# Patient Record
Sex: Male | Born: 1970
Health system: Southern US, Community
[De-identification: ages and names within clinical notes are randomized; demographics above are authoritative.]

## PROBLEM LIST (undated history)

## (undated) DIAGNOSIS — I10 Essential (primary) hypertension: Secondary | ICD-10-CM

## (undated) DIAGNOSIS — F419 Anxiety disorder, unspecified: Secondary | ICD-10-CM

## (undated) DIAGNOSIS — E785 Hyperlipidemia, unspecified: Secondary | ICD-10-CM

## (undated) DIAGNOSIS — E669 Obesity, unspecified: Secondary | ICD-10-CM

## (undated) DIAGNOSIS — R972 Elevated prostate specific antigen [PSA]: Secondary | ICD-10-CM

## (undated) DIAGNOSIS — J45909 Unspecified asthma, uncomplicated: Secondary | ICD-10-CM

## (undated) DIAGNOSIS — N401 Enlarged prostate with lower urinary tract symptoms: Secondary | ICD-10-CM

## (undated) DIAGNOSIS — C801 Malignant (primary) neoplasm, unspecified: Secondary | ICD-10-CM

## (undated) DIAGNOSIS — T7840XA Allergy, unspecified, initial encounter: Secondary | ICD-10-CM

## (undated) DIAGNOSIS — M109 Gout, unspecified: Secondary | ICD-10-CM

## (undated) DIAGNOSIS — K219 Gastro-esophageal reflux disease without esophagitis: Secondary | ICD-10-CM

## (undated) DIAGNOSIS — F32A Depression, unspecified: Secondary | ICD-10-CM

## (undated) DIAGNOSIS — R718 Other abnormality of red blood cells: Secondary | ICD-10-CM

## (undated) DIAGNOSIS — N138 Other obstructive and reflux uropathy: Secondary | ICD-10-CM

## (undated) DIAGNOSIS — R739 Hyperglycemia, unspecified: Secondary | ICD-10-CM

## (undated) HISTORY — DX: Hyperglycemia, unspecified: R73.9

## (undated) HISTORY — DX: Gastro-esophageal reflux disease without esophagitis: K21.9

## (undated) HISTORY — DX: Allergy, unspecified, initial encounter: T78.40XA

## (undated) HISTORY — DX: Obesity, unspecified: E66.9

## (undated) HISTORY — DX: Essential (primary) hypertension: I10

## (undated) HISTORY — DX: Other abnormality of red blood cells: R71.8

## (undated) HISTORY — PX: SHOULDER SURGERY: SHX246

## (undated) HISTORY — DX: Anxiety disorder, unspecified: F41.9

## (undated) HISTORY — DX: Gout, unspecified: M10.9

---

## 1998-06-01 DIAGNOSIS — R569 Unspecified convulsions: Secondary | ICD-10-CM

## 1998-06-01 HISTORY — DX: Unspecified convulsions: R56.9

## 2006-12-11 ENCOUNTER — Other Ambulatory Visit: Payer: Self-pay

## 2006-12-11 ENCOUNTER — Emergency Department: Payer: Self-pay | Admitting: Emergency Medicine

## 2009-04-07 IMAGING — US ABDOMEN ULTRASOUND
1 series · 17 of 25 positions shown · non-contrast
Comparison: none

REASON FOR EXAM: ELEVATED LFTS, EPIGASTRIC PAIN
COMMENTS:

[Series 1: abdomen ultrasound · 17 of 61 slices shown]
[im 1/61]
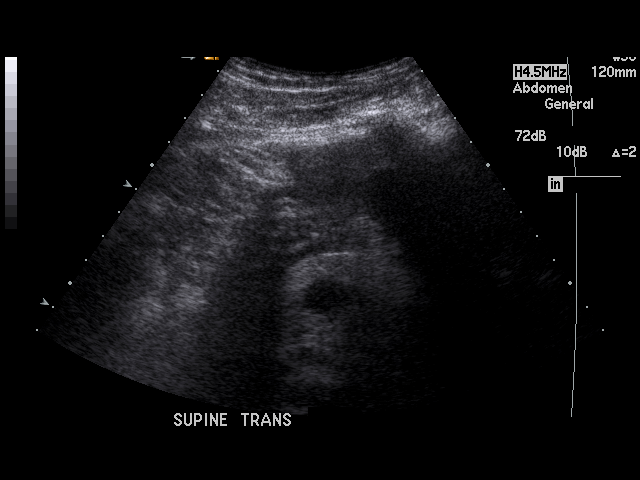
[im 6/61]
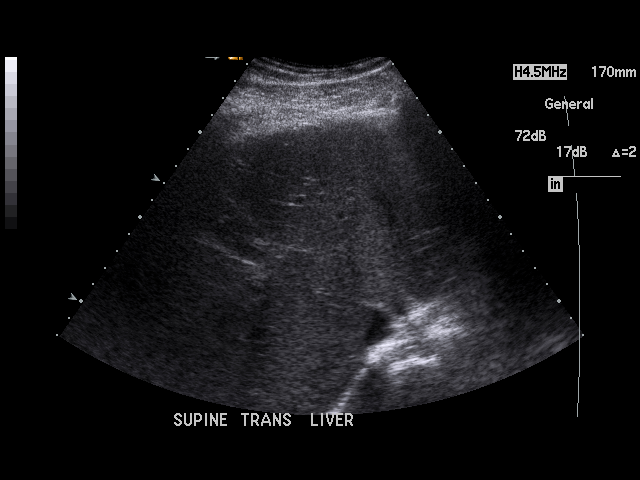
[im 8/61]
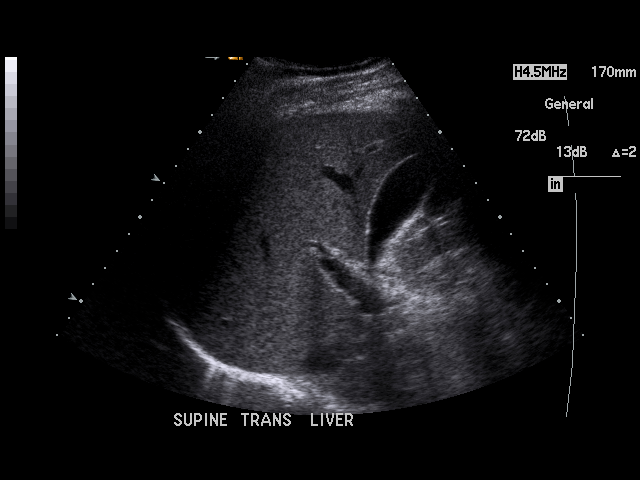
[im 13/61]
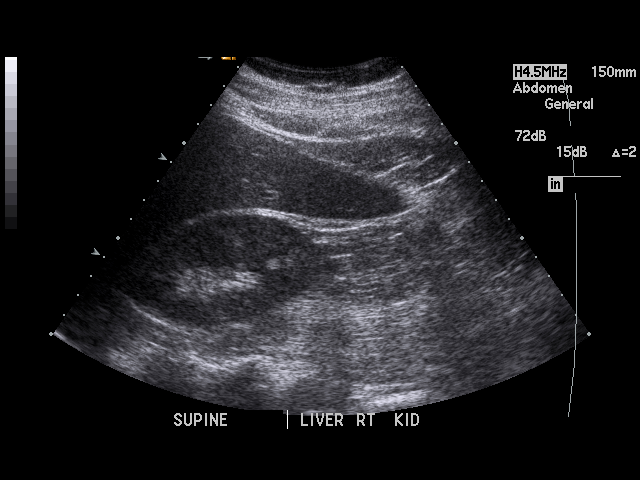
[im 16/61]
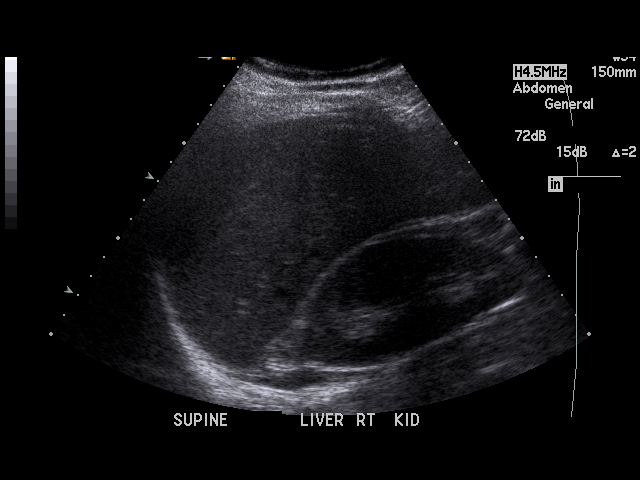
[im 21/61]
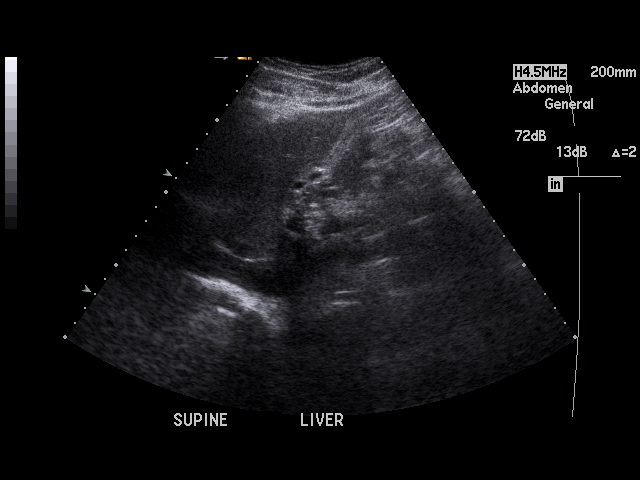
[im 23/61]
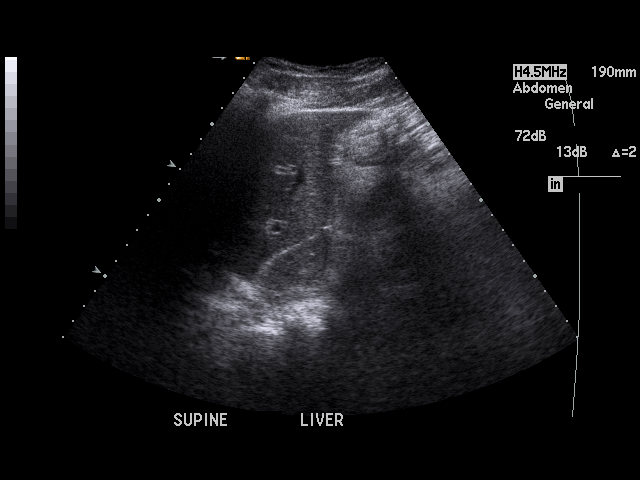
[im 28/61]
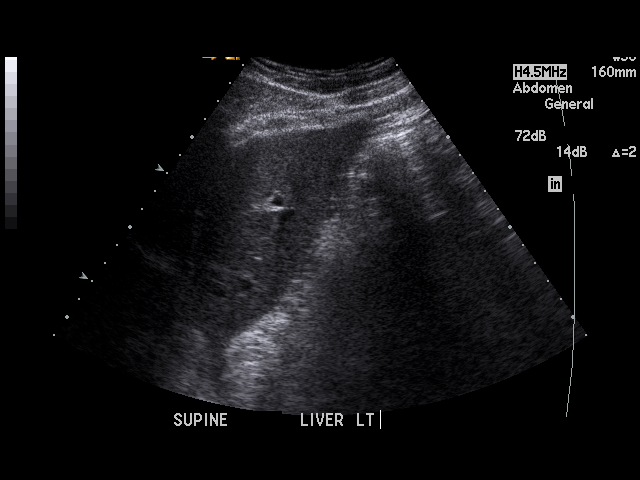
[im 31/61]
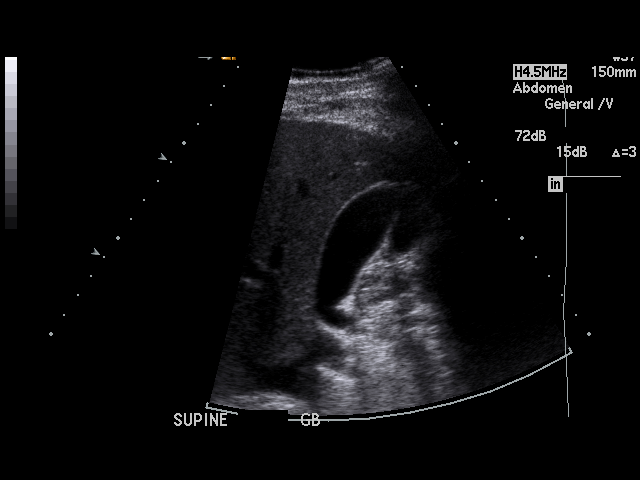
[im 33/61]
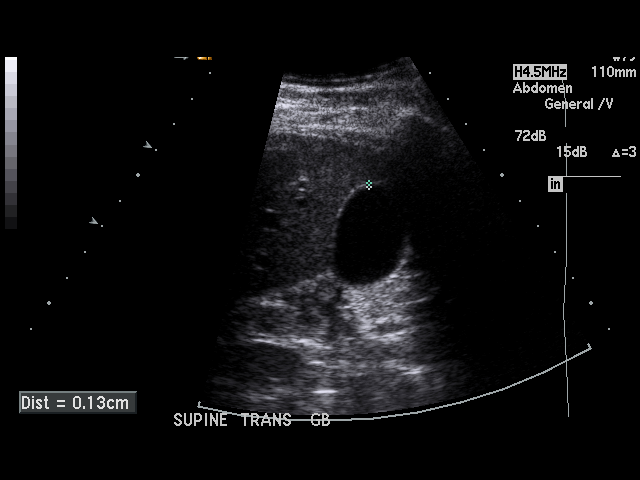
[im 38/61]
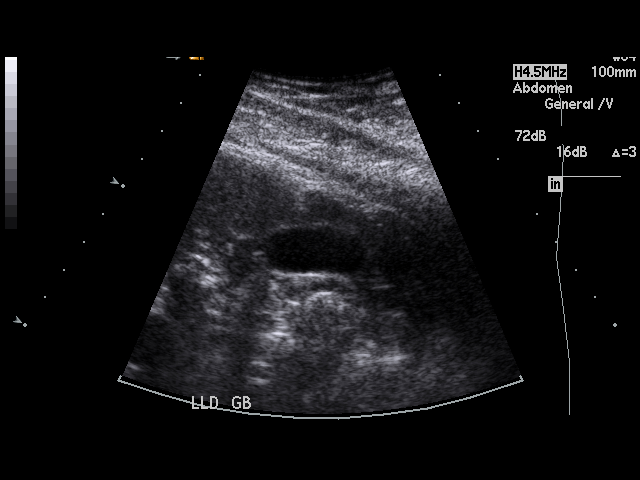
[im 41/61]
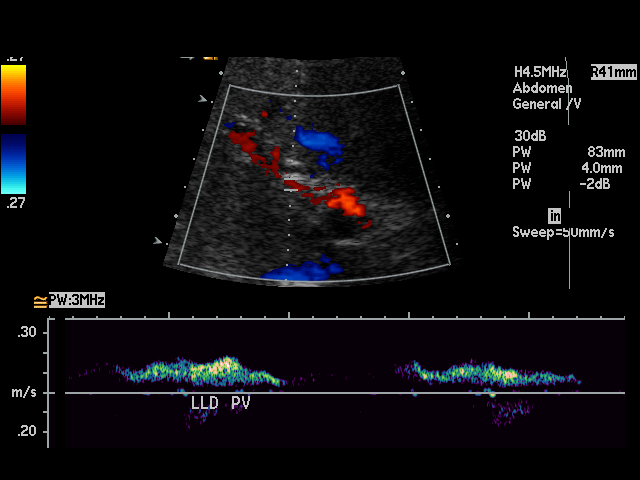
[im 46/61]
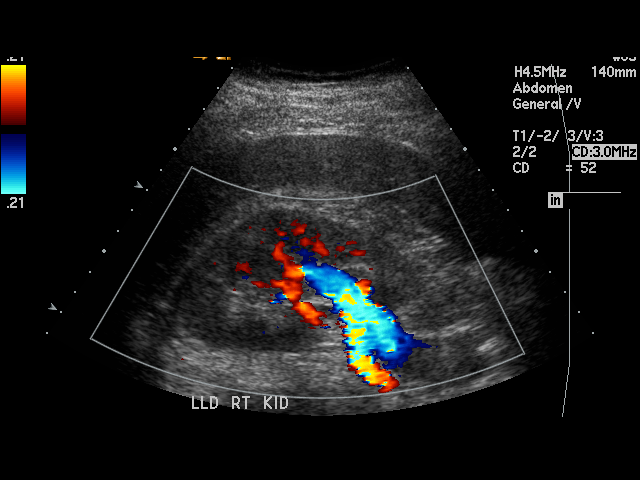
[im 48/61]
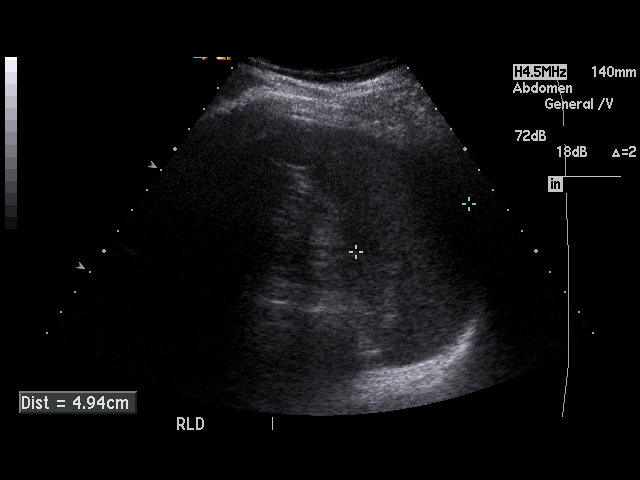
[im 53/61]
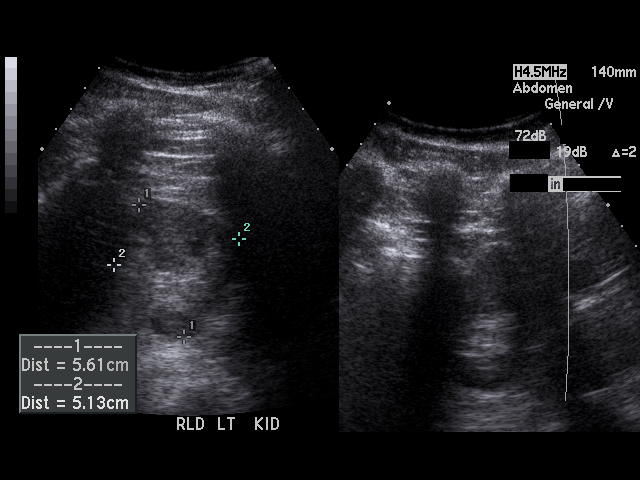
[im 56/61]
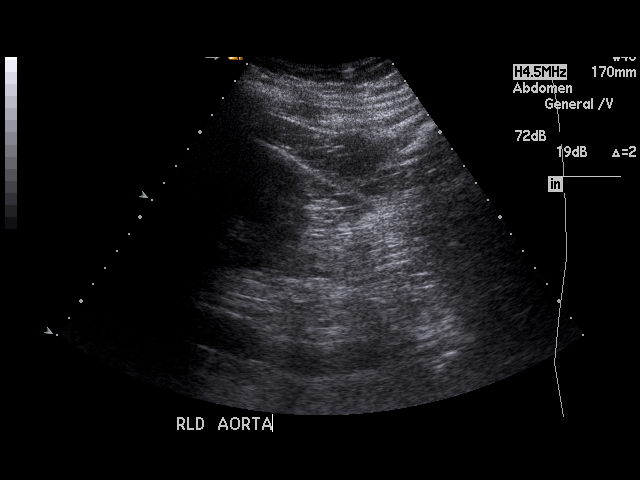
[im 61/61]
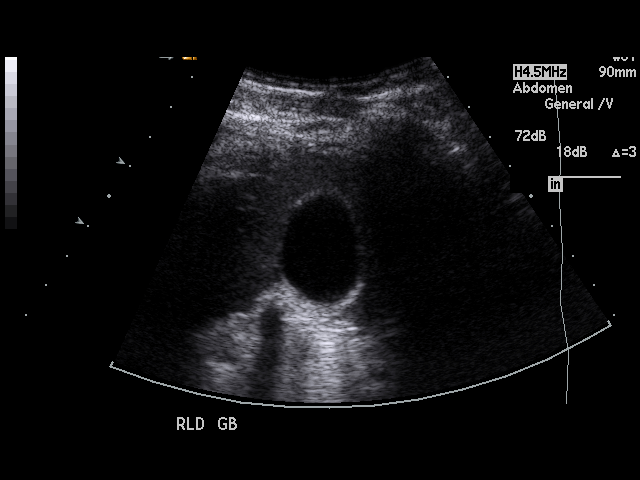

[17 of 25 positions shown; findings below may reference images not displayed]

PROCEDURE:     US  - US ABDOMEN GENERAL SURVEY  - December 12, 2006  [DATE]

RESULT:     Emergent sonographic evaluation of the abdomen is performed. The
gallbladder is normal. There is no wall thickening or stone evident. The
common bile duct diameter is normal at 3.8 mm. The pancreas is only
partially seen but shows no gross abnormality. The aorta, liver, spleen,
kidneys and portal venous flow are normal.
IMPRESSION: Normal appearing abdominal sonogram. The pancreas is only
partially seen.

## 2009-11-29 LAB — PSA: PSA: NORMAL

## 2011-11-16 LAB — LIPID PANEL
CHOLESTEROL: 150 mg/dL (ref 0–200)
HDL: 52 mg/dL (ref 35–70)
LDL CALC: 84 mg/dL
TRIGLYCERIDES: 68 mg/dL (ref 40–160)

## 2011-11-16 LAB — HEMOGLOBIN A1C: HEMOGLOBIN A1C: 5 % (ref 4.0–6.0)

## 2015-02-09 ENCOUNTER — Encounter: Payer: Self-pay | Admitting: Family Medicine

## 2015-02-09 DIAGNOSIS — I10 Essential (primary) hypertension: Secondary | ICD-10-CM | POA: Insufficient documentation

## 2015-02-09 DIAGNOSIS — F411 Generalized anxiety disorder: Secondary | ICD-10-CM | POA: Insufficient documentation

## 2015-02-09 DIAGNOSIS — R739 Hyperglycemia, unspecified: Secondary | ICD-10-CM | POA: Insufficient documentation

## 2015-02-09 DIAGNOSIS — E669 Obesity, unspecified: Secondary | ICD-10-CM | POA: Insufficient documentation

## 2015-02-09 DIAGNOSIS — J302 Other seasonal allergic rhinitis: Secondary | ICD-10-CM | POA: Insufficient documentation

## 2015-02-09 DIAGNOSIS — K219 Gastro-esophageal reflux disease without esophagitis: Secondary | ICD-10-CM | POA: Insufficient documentation

## 2015-02-11 ENCOUNTER — Ambulatory Visit: Payer: Self-pay | Admitting: Family Medicine

## 2015-10-17 ENCOUNTER — Ambulatory Visit: Payer: Self-pay | Admitting: Family Medicine

## 2015-10-23 ENCOUNTER — Telehealth: Payer: Self-pay | Admitting: Family Medicine

## 2015-10-23 DIAGNOSIS — R739 Hyperglycemia, unspecified: Secondary | ICD-10-CM

## 2015-10-23 DIAGNOSIS — I10 Essential (primary) hypertension: Secondary | ICD-10-CM

## 2015-10-23 DIAGNOSIS — Z13220 Encounter for screening for lipoid disorders: Secondary | ICD-10-CM

## 2015-10-23 DIAGNOSIS — Z114 Encounter for screening for human immunodeficiency virus [HIV]: Secondary | ICD-10-CM

## 2015-10-23 NOTE — Telephone Encounter (Signed)
Has appointment tomorrow but requesting a order for labs before his appointment

## 2015-10-24 ENCOUNTER — Encounter: Payer: Self-pay | Admitting: Family Medicine

## 2015-10-24 ENCOUNTER — Ambulatory Visit (INDEPENDENT_AMBULATORY_CARE_PROVIDER_SITE_OTHER): Payer: 59 | Admitting: Family Medicine

## 2015-10-24 VITALS — BP 138/88 | HR 87 | Temp 97.5°F | Resp 16 | Ht 69.0 in | Wt 228.9 lb

## 2015-10-24 DIAGNOSIS — F411 Generalized anxiety disorder: Secondary | ICD-10-CM | POA: Diagnosis not present

## 2015-10-24 DIAGNOSIS — R739 Hyperglycemia, unspecified: Secondary | ICD-10-CM

## 2015-10-24 DIAGNOSIS — I10 Essential (primary) hypertension: Secondary | ICD-10-CM

## 2015-10-24 MED ORDER — VALSARTAN-HYDROCHLOROTHIAZIDE 320-25 MG PO TABS: 1.0000 | ORAL_TABLET | Freq: Every day | ORAL | Status: DC

## 2015-10-24 MED ORDER — ALPRAZOLAM 0.5 MG PO TABS: 0.5000 mg | ORAL_TABLET | Freq: Two times a day (BID) | ORAL | Status: DC | PRN

## 2015-10-24 MED ORDER — CITALOPRAM HYDROBROMIDE 20 MG PO TABS: 20.0000 mg | ORAL_TABLET | Freq: Every day | ORAL | Status: DC

## 2015-10-24 NOTE — Telephone Encounter (Signed)
Please contact patient. Ready for pick up

## 2015-10-24 NOTE — Addendum Note (Signed)
Addended by: Alba CorySOWLES, Debany Vantol F on: 10/24/2015 12:33 PM   Modules accepted: Kipp BroodSmartSet

## 2015-10-24 NOTE — Progress Notes (Signed)
Name: Johnathan Eaton   MRN: [id number]    DOB: [birth year]   Date:10/24/2015       Progress Note  Subjective  Chief Complaint  Chief Complaint  Patient presents with  . Hypertension  . Medication Refill    HPI  HTN: he states he has been stretching his medication because he was about to run out of medication. His bp today is high. He denies chest pain or palpitation. He states Micardis/HCTZ is too expensive , we will try Valsartan/HCTZ  GAD: he states he is not taking medication daily. He feels nervous or on edge all the time. Worries about his parents ( both have dementia), taking alprazolam very seldom for panic attack, it lasts him 6 months.   Hyperglycemia: he denies polyphagia, polydipsia or polyuria  Patient Active Problem List   Diagnosis Date Noted  . Allergic rhinitis, seasonal 02/09/2015  . GAD (generalized anxiety disorder) 02/09/2015  . Benign hypertension 02/09/2015  . Blood glucose elevated 02/09/2015  . Gastric reflux 02/09/2015  . Obesity (BMI 30-39.9) 02/09/2015    Past Surgical History  Procedure Laterality Date  . Shoulder surgery Left     Rotator Cuff Repair    Family History  Problem Relation Age of Onset  . Hypertension Mother   . Diabetes Mother   . Hypertension Father   . Hypertension Brother     Social History   Social History  . Marital Status: Single    Spouse Name: N/A  . Number of Children: N/A  . Years of Education: N/A   Occupational History  . Not on file.   Social History Main Topics  . Smoking status: Never Smoker   . Smokeless tobacco: Never Used  . Alcohol Use: No  . Drug Use: No  . Sexual Activity:    Partners: Female   Other Topics Concern  . Not on file   Social History Narrative     Current outpatient prescriptions:  .  aspirin (ASPIRIN ADULT LOW STRENGTH) 81 MG chewable tablet, Chew by mouth., Disp: , Rfl:  .  citalopram (CELEXA) 20 MG tablet, Take 1 tablet (20 mg total) by mouth daily., Disp: 90  tablet, Rfl: 1 .  ALPRAZolam (XANAX) 0.5 MG tablet, Take 1 tablet (0.5 mg total) by mouth 2 (two) times daily as needed for anxiety. Reported on 10/24/2015, Disp: 30 tablet, Rfl: 0 .  valsartan-hydrochlorothiazide (DIOVAN-HCT) 320-25 MG tablet, Take 1 tablet by mouth daily., Disp: 90 tablet, Rfl: 1  No Known Allergies   ROS  Constitutional: Negative for fever or weight change.  Respiratory: Negative for cough and shortness of breath.   Cardiovascular: Negative for chest pain or palpitations.  Gastrointestinal: Negative for abdominal pain, no bowel changes.  Musculoskeletal: Negative for gait problem or joint swelling.  Skin: Negative for rash.  Neurological: Negative for dizziness or headache.  No other specific complaints in a complete review of systems (except as listed in HPI above).  Objective  Filed Vitals:   10/24/15 1137  BP: 138/96  Pulse: 87  Temp: 97.5 F (36.4 C)  TempSrc: Oral  Resp: 16  Height: 5\' 9"  (1.753 m)  Weight: 228 lb 14.4 oz (103.828 kg)  SpO2: 97%    Body mass index is 33.79 kg/(m^2).  Physical Exam  Constitutional: Patient appears well-developed and well-nourished.No distress.  HEENT: head atraumatic, normocephalic, pupils equal and reactive to light,  neck supple, throat within normal limits Cardiovascular: Normal rate, regular rhythm and normal heart sounds.  No murmur heard.  No BLE edema. Pulmonary/Chest: Effort normal and breath sounds normal. No respiratory distress. Abdominal: Soft.  There is no tenderness. Psychiatric: Patient has a normal mood and affect. behavior is normal. Judgment and thought content normal.  PHQ2/9: Depression screen PHQ 2/9 10/24/2015  Decreased Interest 0  Down, Depressed, Hopeless 0  PHQ - 2 Score 0     Fall Risk: Fall Risk  10/24/2015  Falls in the past year? No     Functional Status Survey: Is the patient deaf or have difficulty hearing?: No Does the patient have difficulty seeing, even when wearing  glasses/contacts?: No (patient recently got contacts but has not been able to put them in) Does the patient have difficulty concentrating, remembering, or making decisions?: No Does the patient have difficulty walking or climbing stairs?: No Does the patient have difficulty dressing or bathing?: No Does the patient have difficulty doing errands alone such as visiting a doctor's office or shopping?: No    Assessment & Plan  1. Benign hypertension  Adjust medication  - valsartan-hydrochlorothiazide (DIOVAN-HCT) 320-25 MG tablet; Take 1 tablet by mouth daily.  Dispense: 90 tablet; Refill: 1  2. Blood glucose elevated  We will check labs  3. GAD (generalized anxiety disorder)  Explained importance of compliance - citalopram (CELEXA) 20 MG tablet; Take 1 tablet (20 mg total) by mouth daily.  Dispense: 90 tablet; Refill: 1 - ALPRAZolam (XANAX) 0.5 MG tablet; Take 1 tablet (0.5 mg total) by mouth 2 (two) times daily as needed for anxiety. Reported on 10/24/2015  Dispense: 30 tablet; Refill: 0

## 2015-12-09 ENCOUNTER — Ambulatory Visit (INDEPENDENT_AMBULATORY_CARE_PROVIDER_SITE_OTHER): Payer: 59 | Admitting: Family Medicine

## 2015-12-09 ENCOUNTER — Encounter: Payer: Self-pay | Admitting: Family Medicine

## 2015-12-09 VITALS — BP 134/88 | HR 85 | Temp 98.7°F | Resp 16 | Wt 228.7 lb

## 2015-12-09 DIAGNOSIS — Z0000 Encounter for general adult medical examination without abnormal findings: Secondary | ICD-10-CM

## 2015-12-09 DIAGNOSIS — I10 Essential (primary) hypertension: Secondary | ICD-10-CM | POA: Diagnosis not present

## 2015-12-09 DIAGNOSIS — M545 Low back pain, unspecified: Secondary | ICD-10-CM

## 2015-12-09 MED ORDER — CYCLOBENZAPRINE HCL 5 MG PO TABS
5.0000 mg | ORAL_TABLET | Freq: Every day | ORAL | Status: DC
Start: 2015-12-09 — End: 2016-04-27

## 2015-12-09 NOTE — Progress Notes (Signed)
Name: Johnathan Eaton   MRN: [id number]    DOB: 1970/07/18   Date:12/09/2015       Progress Note  Subjective  Chief Complaint  Chief Complaint  Patient presents with  . Annual Exam  . Labs Only    HPI  Male Exam: he has been feeling well, not taking Celexa daily and explained it does not work that way - he has some wild dreams with the medication and offered to change it but he wants to hold off for now. He states he has normal libido, he has increase in urinary frequency for the first few hours after he takes diovan/hctz otherwise no urinary problems,. He has normal erections.   Acute back pain: he was picking a heavy item ( a mixing bowl of concrete at work ) last Wednesday and felt a pop and a pull on his back. He has been taking Advil and symptoms have improved, he states he is doing well today. No bladder or bowel continence, no radiculitis.   HTN: we switched him from Micardis/hctz to Valsartan/HCTZ - he is not checking his bp at home. He denies chest pain, palpitation or SOB  Patient Active Problem List   Diagnosis Date Noted  . Allergic rhinitis, seasonal 02/09/2015  . GAD (generalized anxiety disorder) 02/09/2015  . Benign hypertension 02/09/2015  . Blood glucose elevated 02/09/2015  . Gastric reflux 02/09/2015  . Obesity (BMI 30-39.9) 02/09/2015    Past Surgical History  Procedure Laterality Date  . Shoulder surgery Left     Rotator Cuff Repair    Family History  Problem Relation Age of Onset  . Hypertension Mother   . Diabetes Mother   . Hypertension Father   . Hypertension Brother     Social History   Social History  . Marital Status: Single    Spouse Name: N/A  . Number of Children: N/A  . Years of Education: N/A   Occupational History  . Not on file.   Social History Main Topics  . Smoking status: Never Smoker   . Smokeless tobacco: Never Used  . Alcohol Use: No  . Drug Use: No  . Sexual Activity:    Partners: Female   Other Topics  Concern  . Not on file   Social History Narrative     Current outpatient prescriptions:  .  ALPRAZolam (XANAX) 0.5 MG tablet, Take 1 tablet (0.5 mg total) by mouth 2 (two) times daily as needed for anxiety. Reported on 10/24/2015, Disp: 30 tablet, Rfl: 0 .  aspirin (ASPIRIN ADULT LOW STRENGTH) 81 MG chewable tablet, Chew by mouth., Disp: , Rfl:  .  valsartan-hydrochlorothiazide (DIOVAN-HCT) 320-25 MG tablet, Take 1 tablet by mouth daily., Disp: 90 tablet, Rfl: 1 .  citalopram (CELEXA) 20 MG tablet, Take 1 tablet (20 mg total) by mouth daily. (Patient not taking: Reported on 12/09/2015), Disp: 90 tablet, Rfl: 1 .  cyclobenzaprine (FLEXERIL) 5 MG tablet, Take 1 tablet (5 mg total) by mouth at bedtime., Disp: 30 tablet, Rfl: 0  No Known Allergies   ROS  Constitutional: Negative for fever or weight change.  Respiratory: Negative for cough and shortness of breath.   Cardiovascular: Negative for chest pain or palpitations.  Gastrointestinal: Negative for abdominal pain, no bowel changes.  Musculoskeletal: Negative for gait problem or joint swelling.  Skin: Negative for rash.  Neurological: Negative for dizziness or headache.  No other specific complaints in a complete review of systems (except as listed in HPI above).  Objective  Filed Vitals:   12/09/15 1554  BP: 134/88  Pulse: 85  Temp: 98.7 F (37.1 C)  TempSrc: Oral  Resp: 16  Weight: 228 lb 11.2 oz (103.738 kg)  SpO2: 98%    Body mass index is 33.76 kg/(m^2).  Physical Exam  Constitutional: Patient appears well-developed and well-nourished. No distress.  HENT: Head: Normocephalic and atraumatic. Ears: B TMs ok, no erythema or effusion; Nose: Nose normal. Mouth/Throat: Oropharynx is clear and moist. No oropharyngeal exudate.  Eyes: Conjunctivae and EOM are normal. Pupils are equal, round, and reactive to light. No scleral icterus.  Neck: Normal range of motion. Neck supple. No JVD present. No thyromegaly present.   Cardiovascular: Normal rate, regular rhythm and normal heart sounds.  No murmur heard. No BLE edema. Pulmonary/Chest: Effort normal and breath sounds normal. No respiratory distress. Abdominal: Soft. Bowel sounds are normal, no distension. There is no tenderness. no masses MALE GENITALIA: Normal descended testes bilaterally, no masses palpated, no hernias, no lesions, no discharge RECTAL: not done Musculoskeletal: Normal range of motion, no joint effusions. No gross deformities Neurological: he is alert and oriented to person, place, and time. No cranial nerve deficit. Coordination, balance, strength, speech and gait are normal.  Skin: Skin is warm and dry. He has a rash on his neck ( seen by Dermatologist - per patient tinea versicolor ), seems deep and irregular border, also has acanthosis nigricans, he chews on finger nails and cutictles and there is some hypopigmentation of his fingers secondary to bitting  Psychiatric: Patient has a normal mood and affect. behavior is normal. Judgment and thought content normal.  PHQ2/9: Depression screen [facility] 2/9 12/09/2015 10/24/2015  Decreased Interest 0 0  Down, Depressed, Hopeless 0 0  PHQ - 2 Score 0 0     Fall Risk: Fall Risk  12/09/2015 10/24/2015  Falls in the past year? No No       Functional Status Survey: Is the patient deaf or have difficulty hearing?: No Does the patient have difficulty seeing, even when wearing glasses/contacts?: No (patient recently got contacts but has not been able to put them in) Does the patient have difficulty concentrating, remembering, or making decisions?: No Does the patient have difficulty walking or climbing stairs?: No Does the patient have difficulty dressing or bathing?: No Does the patient have difficulty doing errands alone such as visiting a doctor's office or shopping?: No  IPSS Questionnaire (AUA-7): Over the past month.   1)  How often have you had a sensation of not emptying your bladder  completely after you finish urinating?  0 - Not at all  2)  How often have you had to urinate again less than two hours after you finished urinating? 1 - Less than 1 time in 5  3)  How often have you found you stopped and started again several times when you urinated?  0 - Not at all  4) How difficult have you found it to postpone urination?  0 - Not at all  5) How often have you had a weak urinary stream?  0 - Not at all  6) How often have you had to push or strain to begin urination?  0 - Not at all  7) How many times did you most typically get up to urinate from the time you went to bed until the time you got up in the morning?  0 - None  Total score:  0-7 mildly symptomatic   8-19 moderately symptomatic   20-35 severely  symptomatic     Assessment & Plan  1. Encounter for routine history and physical exam for male  Discussed importance of 150 minutes of physical activity weekly, eat two servings of fish weekly, eat one serving of tree nuts ( cashews, pistachios, pecans, almonds.Marland Kitchen.) every other day, eat 6 servings of fruit/vegetables daily and drink plenty of water and avoid sweet beverages.    2. Acute low back pain  He would like to take medication for prn use only, he is going to work, not affecting his ability to function, advised to continue nsaid's and take Flexeril only prn  - cyclobenzaprine (FLEXERIL) 5 MG tablet; Take 1 tablet (5 mg total) by mouth at bedtime.  Dispense: 30 tablet; Refill: 0  3. Benign hypertension  Continue medication

## 2015-12-17 ENCOUNTER — Encounter: Payer: 59 | Admitting: Family Medicine

## 2015-12-30 ENCOUNTER — Ambulatory Visit
Admission: EM | Admit: 2015-12-30 | Discharge: 2015-12-30 | Disposition: A | Discharge status: Home / Self Care | Payer: 59 | Attending: Family Medicine | Admitting: Family Medicine

## 2015-12-30 DIAGNOSIS — F419 Anxiety disorder, unspecified: Secondary | ICD-10-CM

## 2015-12-30 DIAGNOSIS — Z79899 Other long term (current) drug therapy: Secondary | ICD-10-CM | POA: Diagnosis not present

## 2015-12-30 DIAGNOSIS — Z7982 Long term (current) use of aspirin: Secondary | ICD-10-CM | POA: Insufficient documentation

## 2015-12-30 MED ORDER — ALPRAZOLAM 0.5 MG PO TABS: 0.5000 mg | ORAL_TABLET | Freq: Two times a day (BID) | ORAL | 0 refills | Status: DC | PRN

## 2015-12-30 MED ORDER — CITALOPRAM HYDROBROMIDE 20 MG PO TABS: 20.0000 mg | ORAL_TABLET | Freq: Every day | ORAL | 0 refills | Status: DC

## 2015-12-30 NOTE — ED Provider Notes (Signed)
[facility]    CSN: [id number] Arrival date & time: 12/30/15  4010  First Provider Contact:  First MD Initiated Contact with Patient 12/30/15 1939        History   Chief Complaint Chief Complaint  Patient presents with  . Anxiety    HPI Johnathan Eaton is a 45 y.o. male.   The history is provided by the patient.  Anxiety  This is a chronic problem. The problem occurs constantly. The problem has not changed since onset.Associated symptoms include chest pain. The symptoms are relieved by medications.    Past Medical History:  Diagnosis Date  . Abnormal red blood cells   . Allergy   . Anxiety   . GERD (gastroesophageal reflux disease)   . Hyperglycemia   . Hypertension   . Obesity     Patient Active Problem List   Diagnosis Date Noted  . Allergic rhinitis, seasonal 02/09/2015  . GAD (generalized anxiety disorder) 02/09/2015  . Benign hypertension 02/09/2015  . Blood glucose elevated 02/09/2015  . Gastric reflux 02/09/2015  . Obesity (BMI 30-39.9) 02/09/2015    Past Surgical History:  Procedure Laterality Date  . SHOULDER SURGERY Left    Rotator Cuff Repair       Home Medications    Prior to Admission medications   Medication Sig Start Date End Date Taking? Authorizing Provider  valsartan-hydrochlorothiazide (DIOVAN-HCT) 320-25 MG tablet Take 1 tablet by mouth daily. 10/24/15  Yes Alba Cory, MD  ALPRAZolam Prudy Feeler) 0.5 MG tablet Take 1 tablet (0.5 mg total) by mouth 2 (two) times daily as needed for anxiety. 12/30/15   Payton Mccallum, MD  aspirin (ASPIRIN ADULT LOW STRENGTH) 81 MG chewable tablet Chew by mouth. 01/20/12   Historical Provider, MD  citalopram (CELEXA) 20 MG tablet Take 1 tablet (20 mg total) by mouth daily. 12/30/15   Payton Mccallum, MD  cyclobenzaprine (FLEXERIL) 5 MG tablet Take 1 tablet (5 mg total) by mouth at bedtime. 12/09/15   Alba Cory, MD    Family History Family History  Problem Relation Age of Onset  .  Hypertension Mother   . Diabetes Mother   . Hypertension Father   . Hypertension Brother     Social History Social History  Substance Use Topics  . Smoking status: Never Smoker  . Smokeless tobacco: Never Used  . Alcohol use No     Allergies   Review of patient's allergies indicates no known allergies.   Review of Systems Review of Systems  Cardiovascular: Positive for chest pain.     Physical Exam Triage Vital Signs ED Triage Vitals  Enc Vitals Group     BP 12/30/15 1930 (!) 148/96     Pulse Rate 12/30/15 1930 78     Resp 12/30/15 1930 18     Temp 12/30/15 1930 98.5 F (36.9 C)     Temp Source 12/30/15 1930 Oral     SpO2 12/30/15 1930 100 %     Weight 12/30/15 1930 225 lb (102.1 kg)     Height 12/30/15 1930  (1.753 m)     Head Circumference --      Peak Flow --      Pain Score 12/30/15 1931 0     Pain Loc --      Pain Edu? --      Excl. in GC? --    No data found.   Updated Vital Signs BP (!) 148/96 (BP Location: Left Arm)   Pulse 78  Temp 98.5 F (36.9 C) (Oral)   Resp 18   Ht 5\' 9"  (1.753 m)   Wt 225 lb (102.1 kg)   SpO2 100%   BMI 33.23 kg/m   Visual Acuity Right Eye Distance:   Left Eye Distance:   Bilateral Distance:    Right Eye Near:   Left Eye Near:    Bilateral Near:     Physical Exam  Constitutional: He is oriented to person, place, and time. He appears well-developed and well-nourished. No distress.  HENT:  Head: Normocephalic and atraumatic.  Cardiovascular: Normal rate, regular rhythm, normal heart sounds and intact distal pulses.   No murmur heard. Pulmonary/Chest: Effort normal and breath sounds normal. No respiratory distress. He has no wheezes. He has no rales.  Abdominal: Soft. Bowel sounds are normal. He exhibits no distension and no mass. There is no tenderness. There is no rebound and no guarding.  Neurological: He is alert and oriented to person, place, and time.  Skin: No rash noted. He is not diaphoretic.    Psychiatric: His behavior is normal. Judgment and thought content normal. His affect is not blunt.  Nursing note and vitals reviewed.    UC Treatments / Results  Labs (all labs ordered are listed, but only abnormal results are displayed) Labs Reviewed - No data to display  EKG  EKG Interpretation None       Radiology No results found.  Procedures .EKG Date/Time: 12/30/2015 8:15 PM Performed by: Payton Mccallum Authorized by: Payton Mccallum   Previous ECG:    Previous ECG:  Unavailable Interpretation:    Interpretation: normal   Rate:    ECG rate assessment: normal   Rhythm:    Rhythm: sinus rhythm   Ectopy:    Ectopy: none   ST segments:    ST segments:  Normal T waves:    T waves: normal     (including critical care time)  Medications Ordered in UC Medications - No data to display   Initial Impression / Assessment and Plan / UC Course  I have reviewed the triage vital signs and the nursing notes.  Pertinent labs & imaging results that were available during my care of the patient were reviewed by me and considered in my medical decision making (see chart for details).  Clinical Course      Final Clinical Impressions(s) / UC Diagnoses   Final diagnoses:  Anxiety    New Prescriptions Discharge Medication List as of 12/30/2015  8:03 PM     Discharge Medication List as of 12/30/2015  8:03 PM     1. Labs/x-ray results and diagnosis reviewed with patient 2. rx as per orders above; reviewed possible side effects, interactions, risks and benefits  3.  Follow-up with pcp   Payton Mccallum, MD 12/30/15 2017

## 2015-12-30 NOTE — ED Triage Notes (Signed)
Patient states he feels like his anxiety has increased since Saturday, he feels like a tingling all over his body, and some lightheadedness.

## 2016-04-13 ENCOUNTER — Telehealth: Payer: Self-pay | Admitting: Family Medicine

## 2016-04-13 ENCOUNTER — Other Ambulatory Visit: Payer: Self-pay | Admitting: Family Medicine

## 2016-04-13 DIAGNOSIS — I10 Essential (primary) hypertension: Secondary | ICD-10-CM

## 2016-04-13 MED ORDER — VALSARTAN-HYDROCHLOROTHIAZIDE 320-25 MG PO TABS: 1.0000 | ORAL_TABLET | Freq: Every day | ORAL | 0 refills | Status: DC

## 2016-04-13 NOTE — Telephone Encounter (Signed)
Sent bp medication, but I will not fill Alprazolam until his follow up

## 2016-04-13 NOTE — Telephone Encounter (Signed)
Pt has upcoming appointment for 04/27/16. He is asking for a refill on alprazolam and valsartan. Please send to [facility]. He is not able to get in any sooner due to work. Please give patient a call once this has been taken care of.

## 2016-04-14 NOTE — Telephone Encounter (Signed)
Pt verbally informed. °

## 2016-04-14 NOTE — Telephone Encounter (Signed)
Please inform patient. Thanks!

## 2016-04-27 ENCOUNTER — Ambulatory Visit (INDEPENDENT_AMBULATORY_CARE_PROVIDER_SITE_OTHER): Payer: 59 | Admitting: Family Medicine

## 2016-04-27 ENCOUNTER — Ambulatory Visit: Payer: 59 | Admitting: Family Medicine

## 2016-04-27 ENCOUNTER — Encounter: Payer: Self-pay | Admitting: Family Medicine

## 2016-04-27 VITALS — BP 126/86 | HR 78 | Temp 98.2°F | Resp 16 | Ht 69.0 in | Wt 226.6 lb

## 2016-04-27 DIAGNOSIS — B360 Pityriasis versicolor: Secondary | ICD-10-CM

## 2016-04-27 DIAGNOSIS — I10 Essential (primary) hypertension: Secondary | ICD-10-CM

## 2016-04-27 DIAGNOSIS — E669 Obesity, unspecified: Secondary | ICD-10-CM | POA: Diagnosis not present

## 2016-04-27 DIAGNOSIS — Z23 Encounter for immunization: Secondary | ICD-10-CM

## 2016-04-27 DIAGNOSIS — F411 Generalized anxiety disorder: Secondary | ICD-10-CM

## 2016-04-27 DIAGNOSIS — R739 Hyperglycemia, unspecified: Secondary | ICD-10-CM

## 2016-04-27 MED ORDER — VALSARTAN-HYDROCHLOROTHIAZIDE 320-25 MG PO TABS: 1.0000 | ORAL_TABLET | Freq: Every day | ORAL | 0 refills | Status: DC

## 2016-04-27 MED ORDER — SELENIUM SULFIDE 2.25 % EX SHAM: 2.5000 mL | MEDICATED_SHAMPOO | CUTANEOUS | 2 refills | Status: DC

## 2016-04-27 MED ORDER — CITALOPRAM HYDROBROMIDE 20 MG PO TABS: 20.0000 mg | ORAL_TABLET | Freq: Every day | ORAL | 0 refills | Status: DC

## 2016-04-27 MED ORDER — ALPRAZOLAM 0.5 MG PO TABS: 0.5000 mg | ORAL_TABLET | Freq: Two times a day (BID) | ORAL | 0 refills | Status: DC | PRN

## 2016-04-27 NOTE — Progress Notes (Signed)
Name: Johnathan Eaton   MRN: [id number]    DOB: [birth year]   Date:04/27/2016       Progress Note  Subjective  Chief Complaint  Chief Complaint  Patient presents with  . Hypertension    pt 6 month follow up pt stated that there are no changes  . Obesity  . Flu Vaccine    pt declined    HPI  HTN: he is taking  Valsartan/HCTZ - he is not checking his bp at home. He denies chest pain, palpitation or SOB. No side effects of medication . Explained importance of having labs done  GAD: he states he is not taking medication daily. He feels nervous or on edge all the time. Worries about his parents ( both have dementia), taking alprazolam very seldom for panic attack, it lasts him 6 months, he takes Citalopram for a period of time when very stressed out, but refuses to take it long term, explained it works best when he takes it daily.   Obesity: his weight is stable, he states he had lost weight, but gained it back during holidays. Discussed life style changes  Hyperglycemia: he denies polyphagia, polydipsia or polyuria.     Patient Active Problem List   Diagnosis Date Noted  . Allergic rhinitis, seasonal 02/09/2015  . GAD (generalized anxiety disorder) 02/09/2015  . Benign hypertension 02/09/2015  . Blood glucose elevated 02/09/2015  . Gastric reflux 02/09/2015  . Obesity (BMI 30-39.9) 02/09/2015    Past Surgical History:  Procedure Laterality Date  . SHOULDER SURGERY Left    Rotator Cuff Repair    Family History  Problem Relation Age of Onset  . Hypertension Mother   . Diabetes Mother   . Hypertension Father   . Hypertension Brother     Social History   Social History  . Marital status: Single    Spouse name: N/A  . Number of children: N/A  . Years of education: N/A   Occupational History  . Not on file.   Social History Main Topics  . Smoking status: Never Smoker  . Smokeless tobacco: Never Used  . Alcohol use No  . Drug use: No  . Sexual activity: Yes     Partners: Female   Other Topics Concern  . Not on file   Social History Narrative  . No narrative on file     Current Outpatient Prescriptions:  .  ALPRAZolam (XANAX) 0.5 MG tablet, Take 1 tablet (0.5 mg total) by mouth 2 (two) times daily as needed for anxiety., Disp: 30 tablet, Rfl: 0 .  aspirin (ASPIRIN ADULT LOW STRENGTH) 81 MG chewable tablet, Chew by mouth., Disp: , Rfl:  .  citalopram (CELEXA) 20 MG tablet, Take 1 tablet (20 mg total) by mouth daily., Disp: 90 tablet, Rfl: 0 .  valsartan-hydrochlorothiazide (DIOVAN-HCT) 320-25 MG tablet, Take 1 tablet by mouth daily., Disp: 90 tablet, Rfl: 0 .  Selenium Sulfide 2.25 % SHAM, Apply 2.5 mLs topically 3 (three) times a week., Disp: 180 mL, Rfl: 2  No Known Allergies   ROS  Constitutional: Negative for fever or weight change.  Respiratory: Negative for cough and shortness of breath.   Cardiovascular: Negative for chest pain or palpitations.  Gastrointestinal: Negative for abdominal pain, no bowel changes.  Musculoskeletal: Negative for gait problem or joint swelling.  Skin: Negative for rash.  Neurological: Negative for dizziness or headache.  No other specific complaints in a complete review of systems (except as listed in HPI above).  Objective  Vitals:   04/27/16 1516  BP: 126/86  Pulse: 78  Resp: 16  Temp: 98.2 F (36.8 C)  SpO2: 98%  Weight: 226 lb 9 oz (102.8 kg)  Height: 5\' 9"  (1.753 m)    Body mass index is 33.46 kg/m.  Physical Exam  Constitutional: Patient appears well-developed and well-nourished. Obese  No distress.  HEENT: head atraumatic, normocephalic, pupils equal and reactive to light, neck supple, throat within normal limits Cardiovascular: Normal rate, regular rhythm and normal heart sounds.  No murmur heard. No BLE edema. Pulmonary/Chest: Effort normal and breath sounds normal. No respiratory distress. Abdominal: Soft.  There is no tenderness. Psychiatric: Patient has a normal mood  and affect. behavior is normal. Judgment and thought content normal. Skin: acanthosis nigricans. He also has tinea versicolor on neck  PHQ2/9: Depression screen [facility] 2/9 12/09/2015 10/24/2015  Decreased Interest 0 0  Down, Depressed, Hopeless 0 0  PHQ - 2 Score 0 0    Fall Risk: Fall Risk  12/09/2015 10/24/2015  Falls in the past year? No No     Assessment & Plan  1. Benign hypertension  Well controlled, continue medication  - valsartan-hydrochlorothiazide (DIOVAN-HCT) 320-25 MG tablet; Take 1 tablet by mouth daily.  Dispense: 90 tablet; Refill: 0  2. Blood glucose elevated  Needs to have labs done  3. GAD (generalized anxiety disorder)  - ALPRAZolam (XANAX) 0.5 MG tablet; Take 1 tablet (0.5 mg total) by mouth 2 (two) times daily as needed for anxiety.  Dispense: 30 tablet; Refill: 0 - citalopram (CELEXA) 20 MG tablet; Take 1 tablet (20 mg total) by mouth daily.  Dispense: 90 tablet; Refill: 0   4. Obesity (BMI 30-39.9)  Discussed with the patient the risk posed by an increased BMI. Discussed importance of portion control, calorie counting and at least 150 minutes of physical activity weekly. Avoid sweet beverages and drink more water. Eat at least 6 servings of fruit and vegetables daily   5. Needs flu shot  Refused   6. Tinea versicolor  - Selenium Sulfide 2.25 % SHAM; Apply 2.5 mLs topically 3 (three) times a week.  Dispense: 180 mL; Refill: 2

## 2016-04-29 ENCOUNTER — Telehealth: Payer: Self-pay

## 2016-04-29 NOTE — Telephone Encounter (Signed)
I am sorry, controlled medication, cannot give another prescription

## 2016-04-29 NOTE — Telephone Encounter (Signed)
Patient lose his Alprazolam prescription and is asking for a refill. Patient states he never filled the one before and went taking that one to the pharmacy they told him it had expired. Told the patient to look all over since he just got the prescription. He stated he has looked and was going to take the prescription in to be filled and could not find it. Please advise.

## 2016-04-30 NOTE — Telephone Encounter (Signed)
Patient informed. 

## 2016-05-04 ENCOUNTER — Encounter: Payer: Self-pay | Admitting: Family Medicine

## 2016-05-04 ENCOUNTER — Ambulatory Visit (INDEPENDENT_AMBULATORY_CARE_PROVIDER_SITE_OTHER): Payer: 59 | Admitting: Family Medicine

## 2016-05-04 VITALS — BP 138/82 | HR 122 | Temp 102.4°F | Resp 16 | Ht 69.0 in | Wt 225.6 lb

## 2016-05-04 DIAGNOSIS — R05 Cough: Secondary | ICD-10-CM | POA: Diagnosis not present

## 2016-05-04 DIAGNOSIS — R6889 Other general symptoms and signs: Secondary | ICD-10-CM

## 2016-05-04 DIAGNOSIS — R059 Cough, unspecified: Secondary | ICD-10-CM

## 2016-05-04 DIAGNOSIS — R509 Fever, unspecified: Secondary | ICD-10-CM | POA: Diagnosis not present

## 2016-05-04 DIAGNOSIS — J029 Acute pharyngitis, unspecified: Secondary | ICD-10-CM

## 2016-05-04 LAB — POCT INFLUENZA A/B: Influenza A, POC: NEGATIVE

## 2016-05-04 LAB — POCT RAPID STREP A (OFFICE): Rapid Strep A Screen: NEGATIVE

## 2016-05-04 MED ORDER — OSELTAMIVIR PHOSPHATE 75 MG PO CAPS: 75.0000 mg | ORAL_CAPSULE | Freq: Two times a day (BID) | ORAL | 0 refills | Status: DC

## 2016-05-04 NOTE — Progress Notes (Signed)
Name: Johnathan RacerDarrell K Eaton   MRN: [id number]    DOB: 1970/09/05   Date:05/04/2016       Progress Note  Subjective  Chief Complaint  Chief Complaint  Patient presents with  . Sore Throat    pt stated that symptoms started yesterday  . Headache  . Cough  . Chills  . Fever    HPI  Flu like symptoms: he was in his garage with the garage door closed and sprayed WD 40 and immediately developed sore throat and headache. He also has a dry cough, no rhinorrhea or nasal congestion. He has noticed some chills this morning. He has normal appetite, denies body aches. He has been taking generic loratadine but did not take any nsaid's or tylenol. He denies SOB or wheezing   Patient Active Problem List   Diagnosis Date Noted  . Allergic rhinitis, seasonal 02/09/2015  . GAD (generalized anxiety disorder) 02/09/2015  . Benign hypertension 02/09/2015  . Blood glucose elevated 02/09/2015  . Gastric reflux 02/09/2015  . Obesity (BMI 30-39.9) 02/09/2015    Past Surgical History:  Procedure Laterality Date  . SHOULDER SURGERY Left    Rotator Cuff Repair    Family History  Problem Relation Age of Onset  . Hypertension Mother   . Diabetes Mother   . Hypertension Father   . Hypertension Brother     Social History   Social History  . Marital status: Single    Spouse name: N/A  . Number of children: N/A  . Years of education: N/A   Occupational History  . Not on file.   Social History Main Topics  . Smoking status: Never Smoker  . Smokeless tobacco: Never Used  . Alcohol use No  . Drug use: No  . Sexual activity: Yes    Partners: Female   Other Topics Concern  . Not on file   Social History Narrative  . No narrative on file     Current Outpatient Prescriptions:  .  ALPRAZolam (XANAX) 0.5 MG tablet, Take 1 tablet (0.5 mg total) by mouth 2 (two) times daily as needed for anxiety., Disp: 30 tablet, Rfl: 0 .  aspirin (ASPIRIN ADULT LOW STRENGTH) 81 MG chewable tablet, Chew by  mouth., Disp: , Rfl:  .  citalopram (CELEXA) 20 MG tablet, Take 1 tablet (20 mg total) by mouth daily., Disp: 90 tablet, Rfl: 0 .  oseltamivir (TAMIFLU) 75 MG capsule, Take 1 capsule (75 mg total) by mouth 2 (two) times daily., Disp: 10 capsule, Rfl: 0 .  Selenium Sulfide 2.25 % SHAM, Apply 2.5 mLs topically 3 (three) times a week., Disp: 180 mL, Rfl: 2 .  valsartan-hydrochlorothiazide (DIOVAN-HCT) 320-25 MG tablet, Take 1 tablet by mouth daily., Disp: 90 tablet, Rfl: 0  No Known Allergies   ROS  Ten systems reviewed and is negative except as mentioned in HPI   Objective  Vitals:   05/04/16 1053  BP: 138/82  Pulse: (!) 122  Resp: 16  Temp: (!) 102.4 F (39.1 C)  TempSrc: Oral  SpO2: 95%  Weight: 225 lb 9 oz (102.3 kg)  Height: 5\' 9"  (1.753 m)    Body mass index is 33.31 kg/m.  Physical Exam  Constitutional: Patient appears well-developed and well-nourished. Obese  No distress.  HEENT: head atraumatic, normocephalic, pupils equal and reactive to light, ears normal TM bilaterally, neck supple, throat within normal limits Cardiovascular: Normal rate, regular rhythm and normal heart sounds.  No murmur heard. No BLE edema. Pulmonary/Chest: Effort normal and  breath sounds normal. No respiratory distress. Abdominal: Soft.  There is no tenderness. Psychiatric: Patient has a normal mood and affect. behavior is normal. Judgment and thought content normal.  Recent Results (from the past 2160 hour(s))  POCT rapid strep A     Status: None   Collection Time: 05/04/16 11:36 AM  Result Value Ref Range   Rapid Strep A Screen Negative Negative  POCT Influenza A/B     Status: None   Collection Time: 05/04/16 11:36 AM  Result Value Ref Range   Influenza A, POC Negative Negative   Influenza B, POC  Negative     PHQ2/9: [facility] 12/09/2015 10/24/2015  Decreased Interest 0 0  Down, Depressed, Hopeless 0 0  PHQ - 2 Score 0 0     Fall Risk: Fall Risk  12/09/2015  10/24/2015  Falls in the past year? No No     Assessment & Plan  1. Sore throat  - POCT rapid strep A - oseltamivir (TAMIFLU) 75 MG capsule; Take 1 capsule (75 mg total) by mouth 2 (two) times daily.  Dispense: 10 capsule; Refill: 0  2. Cough  - POCT rapid strep A - POCT Influenza A/B - oseltamivir (TAMIFLU) 75 MG capsule; Take 1 capsule (75 mg total) by mouth 2 (two) times daily.  Dispense: 10 capsule; Refill: 0  3. Fever chills   POCT Influenza A/B - oseltamivir (TAMIFLU) 75 MG capsule; Take 1 capsule (75 mg total) by mouth 2 (two) times daily.  Dispense: 10 capsule; Refill: 0   4. Flu-like symptoms  Daughter missed school today because she has diarrhea, symptoms are flu like, not sure if secondary to inhalation, but normal auscultation during exam, we will try Tamiflu and if no improvement call back for CXR to make sure not secondary to inhalation injury  - oseltamivir (TAMIFLU) 75 MG capsule; Take 1 capsule (75 mg total) by mouth 2 (two) times daily.  Dispense: 10 capsule; Refill: 0

## 2016-09-03 ENCOUNTER — Other Ambulatory Visit: Payer: Self-pay

## 2016-09-03 DIAGNOSIS — I10 Essential (primary) hypertension: Secondary | ICD-10-CM

## 2016-09-03 MED ORDER — VALSARTAN-HYDROCHLOROTHIAZIDE 320-25 MG PO TABS: 1.0000 | ORAL_TABLET | Freq: Every day | ORAL | 0 refills | Status: DC

## 2016-09-03 NOTE — Telephone Encounter (Signed)
Patient has an upcoming appointment 10/19/16 with Dr. Carlynn Purl

## 2016-10-19 ENCOUNTER — Encounter: Payer: Self-pay | Admitting: Family Medicine

## 2016-10-19 ENCOUNTER — Ambulatory Visit (INDEPENDENT_AMBULATORY_CARE_PROVIDER_SITE_OTHER): Payer: Managed Care, Other (non HMO) | Admitting: Family Medicine

## 2016-10-19 VITALS — BP 136/92 | HR 101 | Temp 98.8°F | Resp 16 | Ht 69.0 in | Wt 229.0 lb

## 2016-10-19 DIAGNOSIS — Z23 Encounter for immunization: Secondary | ICD-10-CM | POA: Diagnosis not present

## 2016-10-19 DIAGNOSIS — E669 Obesity, unspecified: Secondary | ICD-10-CM

## 2016-10-19 DIAGNOSIS — I10 Essential (primary) hypertension: Secondary | ICD-10-CM | POA: Diagnosis not present

## 2016-10-19 DIAGNOSIS — Z13220 Encounter for screening for lipoid disorders: Secondary | ICD-10-CM | POA: Diagnosis not present

## 2016-10-19 DIAGNOSIS — R739 Hyperglycemia, unspecified: Secondary | ICD-10-CM | POA: Diagnosis not present

## 2016-10-19 DIAGNOSIS — F411 Generalized anxiety disorder: Secondary | ICD-10-CM | POA: Diagnosis not present

## 2016-10-19 MED ORDER — ATENOLOL 25 MG PO TABS: 25.0000 mg | ORAL_TABLET | Freq: Every day | ORAL | 1 refills | Status: DC

## 2016-10-19 MED ORDER — CITALOPRAM HYDROBROMIDE 20 MG PO TABS: 20.0000 mg | ORAL_TABLET | Freq: Every day | ORAL | 1 refills | Status: DC

## 2016-10-19 MED ORDER — VALSARTAN-HYDROCHLOROTHIAZIDE 320-25 MG PO TABS: 1.0000 | ORAL_TABLET | Freq: Every day | ORAL | 1 refills | Status: DC

## 2016-10-19 MED ORDER — ALPRAZOLAM 0.5 MG PO TABS: 0.5000 mg | ORAL_TABLET | Freq: Two times a day (BID) | ORAL | 0 refills | Status: DC | PRN

## 2016-10-19 NOTE — Progress Notes (Signed)
Name: Johnathan Eaton   MRN: [id number]    DOB: 12/14/1970   Date:10/19/2016       Progress Note  Subjective  Chief Complaint  Chief Complaint  Patient presents with  . Medication Refill    6 month F/U  . Hypertension    Denies any symptoms  . Anxiety    Stressed taking care of parents  . Obesity    HPI   HTN: he is taking  Valsartan/HCTZ, medication is expensive and he will ask pharmacist if there is a cheaper type. He denies chest pain, palpitation ( except when he is anxious ) or SOB. No side effects of medication . Explained importance of having labs done, he will return fasting.   GAD: he states he has resumed Celexa daily ( 3 weeks ago) because he has been more worried about his parents lately, they both dementia and he gets very anxious worrying about them. He got his parents are in different nursing homes.  He feels nervous or on edge all the time.taking alprazolam very seldom for panic attack, it lasts him 6 months.   Obesity: his weight is stable, explained the importance of life style modification, stop drinking sweet beverages.    Hyperglycemia: he denies polyphagia, polydipsia or polyuria, we will check labs.   Patient Active Problem List   Diagnosis Date Noted  . Allergic rhinitis, seasonal 02/09/2015  . GAD (generalized anxiety disorder) 02/09/2015  . Benign hypertension 02/09/2015  . Blood glucose elevated 02/09/2015  . Gastric reflux 02/09/2015  . Obesity (BMI 30-39.9) 02/09/2015    Past Surgical History:  Procedure Laterality Date  . SHOULDER SURGERY Left    Rotator Cuff Repair    Family History  Problem Relation Age of Onset  . Hypertension Mother   . Diabetes Mother   . Hypertension Father   . Hypertension Brother     Social History   Social History  . Marital status: Single    Spouse name: N/A  . Number of children: N/A  . Years of education: N/A   Occupational History  . Not on file.   Social History Main Topics  . Smoking  status: Never Smoker  . Smokeless tobacco: Never Used  . Alcohol use No  . Drug use: No  . Sexual activity: Yes    Partners: Female   Other Topics Concern  . Not on file   Social History Narrative  . No narrative on file     Current Outpatient Prescriptions:  .  ALPRAZolam (XANAX) 0.5 MG tablet, Take 1 tablet (0.5 mg total) by mouth 2 (two) times daily as needed for anxiety., Disp: 30 tablet, Rfl: 0 .  aspirin (ASPIRIN ADULT LOW STRENGTH) 81 MG chewable tablet, Chew by mouth., Disp: , Rfl:  .  citalopram (CELEXA) 20 MG tablet, Take 1 tablet (20 mg total) by mouth daily., Disp: 90 tablet, Rfl: 1 .  Selenium Sulfide 2.25 % SHAM, Apply 2.5 mLs topically 3 (three) times a week., Disp: 180 mL, Rfl: 2 .  valsartan-hydrochlorothiazide (DIOVAN-HCT) 320-25 MG tablet, Take 1 tablet by mouth daily., Disp: 90 tablet, Rfl: 1 .  atenolol (TENORMIN) 25 MG tablet, Take 1 tablet (25 mg total) by mouth daily. In the pm's prn anxiety and bp, Disp: 90 tablet, Rfl: 1  No Known Allergies   ROS  Constitutional: Negative for fever or weight change.  Respiratory: Negative for cough and shortness of breath.   Cardiovascular: Negative for chest pain or palpitations.  Gastrointestinal: Negative for  abdominal pain, no bowel changes.  Musculoskeletal: Negative for gait problem or joint swelling.  Skin: Negative for rash.  Neurological: Negative for dizziness or headache.  No other specific complaints in a complete review of systems (except as listed in HPI above).  Objective  Vitals:   10/19/16 1558  BP: (!) 136/92  Pulse: (!) 101  Resp: 16  Temp: 98.8 F (37.1 C)  TempSrc: Oral  SpO2: 98%  Weight: 229 lb (103.9 kg)  Height: 5\' 9"  (1.753 m)    Body mass index is 33.82 kg/m.  Physical Exam  Constitutional: Patient appears well-developed and well-nourished. Obese No distress.  HEENT: head atraumatic, normocephalic, pupils equal and reactive to light, neck supple, throat within normal  limits Cardiovascular: Normal rate, regular rhythm and normal heart sounds.  No murmur heard. No BLE edema. Pulmonary/Chest: Effort normal and breath sounds normal. No respiratory distress. Abdominal: Soft.  There is no tenderness. Psychiatric: Patient has a normal mood and affect. behavior is normal. Judgment and thought content normal.  PHQ2/9: Depression screen [facility] 2/9 10/19/2016 12/09/2015 10/24/2015  Decreased Interest 0 0 0  Down, Depressed, Hopeless 0 0 0  PHQ - 2 Score 0 0 0     Fall Risk: Fall Risk  10/19/2016 12/09/2015 10/24/2015  Falls in the past year? No No No     Functional Status Survey: Is the patient deaf or have difficulty hearing?: No Does the patient have difficulty seeing, even when wearing glasses/contacts?: No Does the patient have difficulty concentrating, remembering, or making decisions?: No Does the patient have difficulty walking or climbing stairs?: No Does the patient have difficulty dressing or bathing?: No Does the patient have difficulty doing errands alone such as visiting a doctor's office or shopping?: No    Assessment & Plan  1. Benign hypertension  - COMPLETE METABOLIC PANEL WITH GFR - CBC with Differential/Platelet - valsartan-hydrochlorothiazide (DIOVAN-HCT) 320-25 MG tablet; Take 1 tablet by mouth daily.  Dispense: 90 tablet; Refill: 1 - atenolol (TENORMIN) 25 MG tablet; Take 1 tablet (25 mg total) by mouth daily. In the pm's prn anxiety and bp  Dispense: 90 tablet; Refill: 1  2. GAD (generalized anxiety disorder)  - citalopram (CELEXA) 20 MG tablet; Take 1 tablet (20 mg total) by mouth daily.  Dispense: 90 tablet; Refill: 1 - ALPRAZolam (XANAX) 0.5 MG tablet; Take 1 tablet (0.5 mg total) by mouth 2 (two) times daily as needed for anxiety.  Dispense: 30 tablet; Refill: 0 - atenolol (TENORMIN) 25 MG tablet; Take 1 tablet (25 mg total) by mouth daily. In the pm's prn anxiety and bp  Dispense: 90 tablet; Refill: 1  3. Obesity (BMI  30-39.9)  Discussed with the patient the risk posed by an increased BMI. Discussed importance of portion control, calorie counting and at least 150 minutes of physical activity weekly. Avoid sweet beverages and drink more water. Eat at least 6 servings of fruit and vegetables daily   4. Blood glucose elevated  - Hemoglobin A1c - Insulin, fasting  5. Lipid screening  - Lipid panel  6. Need for Tdap vaccination  refused

## 2017-01-20 ENCOUNTER — Other Ambulatory Visit: Payer: Self-pay

## 2017-01-20 MED ORDER — OLMESARTAN MEDOXOMIL-HCTZ 40-25 MG PO TABS: 1.0000 | ORAL_TABLET | Freq: Every day | ORAL | 0 refills | Status: DC

## 2017-01-25 ENCOUNTER — Encounter: Payer: Managed Care, Other (non HMO) | Admitting: Family Medicine

## 2017-02-08 ENCOUNTER — Telehealth: Payer: Self-pay

## 2017-02-08 ENCOUNTER — Other Ambulatory Visit: Payer: Self-pay | Admitting: Family Medicine

## 2017-02-08 MED ORDER — LOSARTAN POTASSIUM-HCTZ 100-25 MG PO TABS: 1.0000 | ORAL_TABLET | Freq: Every day | ORAL | 0 refills | Status: DC

## 2017-02-08 MED ORDER — VALSARTAN-HYDROCHLOROTHIAZIDE 320-25 MG PO TABS: 1.0000 | ORAL_TABLET | Freq: Every day | ORAL | 1 refills | Status: DC

## 2017-02-08 NOTE — Progress Notes (Signed)
Patient states he rather not keep switching BP medications. [facility] [place] [facility] and would like to stay on this. Please send 6 months supplies of Diovan and cancel the others. Thanks.

## 2017-02-08 NOTE — Telephone Encounter (Signed)
After patient Insurance a 90 day supply of Olmesartan is over $600. Please change.

## 2017-03-01 ENCOUNTER — Telehealth: Payer: Self-pay | Admitting: Family Medicine

## 2017-03-01 NOTE — Telephone Encounter (Signed)
Pt requesting return call after 3pm. Pertaining to his prescriptions [phone]

## 2017-03-02 NOTE — Telephone Encounter (Signed)
Spoke with patient wife about Valsartan recall and she is going to call Insurance to see what they cover for his BP medication. Patient never called insurance to see what was covered after Olmesartan was found to be too expensive.

## 2017-03-16 ENCOUNTER — Telehealth: Payer: Self-pay | Admitting: Family Medicine

## 2017-03-16 NOTE — Telephone Encounter (Signed)
Pt needs refill on Valsartan. Pt is completely out. [facility].

## 2017-03-17 ENCOUNTER — Other Ambulatory Visit: Payer: Self-pay | Admitting: Family Medicine

## 2017-03-17 MED ORDER — VALSARTAN-HYDROCHLOROTHIAZIDE 320-25 MG PO TABS: 1.0000 | ORAL_TABLET | Freq: Every day | ORAL | 0 refills | Status: DC

## 2017-03-17 NOTE — Telephone Encounter (Signed)
[facility] as [facility].

## 2017-04-13 ENCOUNTER — Other Ambulatory Visit: Payer: Self-pay | Admitting: Family Medicine

## 2017-04-13 NOTE — Telephone Encounter (Signed)
Hypertension medication request: [facility]  Last office visit pertaining to hypertension: 10/19/2016  BP Readings from Last 3 Encounters:  10/19/16 (!) 136/92  05/04/16 138/82  04/27/16 126/86    No results found for: CREATININE, BUN, NA, K, CL, CO2   Has an upcoming appointment on 04/16/2017.

## 2017-04-16 ENCOUNTER — Encounter: Payer: Self-pay | Admitting: Family Medicine

## 2017-04-16 ENCOUNTER — Ambulatory Visit (INDEPENDENT_AMBULATORY_CARE_PROVIDER_SITE_OTHER): Payer: Managed Care, Other (non HMO) | Admitting: Family Medicine

## 2017-04-16 VITALS — BP 134/92 | HR 98 | Temp 98.0°F | Resp 18 | Ht 69.0 in | Wt 229.0 lb

## 2017-04-16 DIAGNOSIS — Z131 Encounter for screening for diabetes mellitus: Secondary | ICD-10-CM

## 2017-04-16 DIAGNOSIS — G8929 Other chronic pain: Secondary | ICD-10-CM

## 2017-04-16 DIAGNOSIS — I10 Essential (primary) hypertension: Secondary | ICD-10-CM | POA: Diagnosis not present

## 2017-04-16 DIAGNOSIS — Z23 Encounter for immunization: Secondary | ICD-10-CM | POA: Diagnosis not present

## 2017-04-16 DIAGNOSIS — M25531 Pain in right wrist: Secondary | ICD-10-CM

## 2017-04-16 DIAGNOSIS — F411 Generalized anxiety disorder: Secondary | ICD-10-CM | POA: Diagnosis not present

## 2017-04-16 DIAGNOSIS — M79671 Pain in right foot: Secondary | ICD-10-CM

## 2017-04-16 DIAGNOSIS — E669 Obesity, unspecified: Secondary | ICD-10-CM | POA: Diagnosis not present

## 2017-04-16 DIAGNOSIS — Z13220 Encounter for screening for lipoid disorders: Secondary | ICD-10-CM

## 2017-04-16 MED ORDER — METOPROLOL SUCCINATE ER 25 MG PO TB24: 12.5000 mg | ORAL_TABLET | Freq: Every evening | ORAL | 1 refills | Status: DC

## 2017-04-16 MED ORDER — VALSARTAN-HYDROCHLOROTHIAZIDE 320-25 MG PO TABS: 1.0000 | ORAL_TABLET | Freq: Every day | ORAL | 1 refills | Status: DC

## 2017-04-16 MED ORDER — ALPRAZOLAM 0.5 MG PO TABS: 0.5000 mg | ORAL_TABLET | Freq: Two times a day (BID) | ORAL | 0 refills | Status: DC | PRN

## 2017-04-16 MED ORDER — CITALOPRAM HYDROBROMIDE 20 MG PO TABS: 20.0000 mg | ORAL_TABLET | Freq: Every day | ORAL | 1 refills | Status: DC

## 2017-04-16 NOTE — Progress Notes (Signed)
Name: Johnathan Eaton   MRN: [id number]    DOB: 09-10-1970   Date:04/16/2017       Progress Note  Subjective  Chief Complaint  Chief Complaint  Patient presents with  . Medication Refill    6 month F/U  . Hypertension    Denies any symptoms  . Anxiety    Same  . Obesity  . Foot Pain    Right Foot Pain, Onset-Months, has been bothering him and progressively getting worst. Was taking anti-inflammatory before and it helped. But would like Dr. Carlynn Purl to check out.    HPI  HTN: he is taking Valsartan/HCTZ, he states cost has gone down but still expensive, discussed changing to ACE but he states he is afraid since uncle had angioedema. He never started Atenolol, still has anxiety, we will try Metoprolol. He denies chest pain or palpitation.   GAD: he is taking Citalopram, most of day of week,  Parents are now on a nursing home, both have dementia and he gets very anxious worrying about them.  Still taking alprazolam prn.   Obesity: his weight is stable, explained the importance of life style modification, he is still drinking sweet tea, lemonade and apple juice.   Wrist pain: he had an acute onset of severe right medial wrist pain a few months ago, it was also swollen but not red. He went to Urgent care and was given prednisone and some other pills and diagnosed with gout, he states pain resolved.  Right foot pain: going on for about 8 months, he states pain is intermittent, triggered by certain movements or pressure applied to the area. No redness or increase in warmth. He states that pain resolved when taking medication for the wrist. Denies any trauma or injuries.   Hyperglycemia: he denies polyphagia, polydipsia or polyuria, we will check labs, he is not fasting, but afraid of needles and never returns for labs, we will check it today      Patient Active Problem List   Diagnosis Date Noted  . Allergic rhinitis, seasonal 02/09/2015  . GAD (generalized anxiety disorder)  02/09/2015  . Benign hypertension 02/09/2015  . Blood glucose elevated 02/09/2015  . Gastric reflux 02/09/2015  . Obesity (BMI 30-39.9) 02/09/2015    Past Surgical History:  Procedure Laterality Date  . SHOULDER SURGERY Left    Rotator Cuff Repair    Family History  Problem Relation Age of Onset  . Hypertension Mother   . Diabetes Mother   . Hypertension Father   . Hypertension Brother     Social History   Socioeconomic History  . Marital status: Single    Spouse name: Not on file  . Number of children: Not on file  . Years of education: Not on file  . Highest education level: Not on file  Social Needs  . Financial resource strain: Not on file  . Food insecurity - worry: Not on file  . Food insecurity - inability: Not on file  . Transportation needs - medical: Not on file  . Transportation needs - non-medical: Not on file  Occupational History  . Not on file  Tobacco Use  . Smoking status: Never Smoker  . Smokeless tobacco: Never Used  Substance and Sexual Activity  . Alcohol use: No    Alcohol/week: 0.0 oz  . Drug use: No  . Sexual activity: Yes    Partners: Female  Other Topics Concern  . Not on file  Social History Narrative  . Not on  file     Current Outpatient Medications:  .  ALPRAZolam (XANAX) 0.5 MG tablet, Take 1 tablet (0.5 mg total) 2 (two) times daily as needed by mouth for anxiety., Disp: 30 tablet, Rfl: 0 .  aspirin (ASPIRIN ADULT LOW STRENGTH) 81 MG chewable tablet, Chew by mouth., Disp: , Rfl:  .  citalopram (CELEXA) 20 MG tablet, Take 1 tablet (20 mg total) daily by mouth., Disp: 90 tablet, Rfl: 1 .  valsartan-hydrochlorothiazide (DIOVAN-HCT) 320-25 MG tablet, Take 1 tablet daily by mouth., Disp: 90 tablet, Rfl: 1 .  metoprolol succinate (TOPROL-XL) 25 MG 24 hr tablet, Take 0.5-1 tablets (12.5-25 mg total) every evening by mouth., Disp: 90 tablet, Rfl: 1 .  Selenium Sulfide 2.25 % SHAM, Apply 2.5 mLs topically 3 (three) times a week.  (Patient not taking: Reported on 04/16/2017), Disp: 180 mL, Rfl: 2  No Known Allergies   ROS  Constitutional: Negative for fever or weight change.  Respiratory: Negative for cough and shortness of breath.   Cardiovascular: Negative for chest pain or palpitations.  Gastrointestinal: Negative for abdominal pain, no bowel changes.  Musculoskeletal: Negative for gait problem or joint swelling.  Skin: Negative for rash.  Neurological: Negative for dizziness or headache.  No other specific complaints in a complete review of systems (except as listed in HPI above).  Objective  Vitals:   04/16/17 1509  BP: (!) 134/92  Pulse: 98  Resp: 18  Temp: 98 F (36.7 C)  TempSrc: Oral  SpO2: 97%  Weight: 229 lb (103.9 kg)  Height: 5\' 9"  (1.753 m)    Body mass index is 33.82 kg/m.  Physical Exam  Constitutional: Patient appears well-developed and well-nourished. Obese  No distress.  HEENT: head atraumatic, normocephalic, pupils equal and reactive to light, neck supple, throat within normal limits Cardiovascular: Normal rate, regular rhythm and normal heart sounds.  No murmur heard. No BLE edema. Pulmonary/Chest: Effort normal and breath sounds normal. No respiratory distress. Abdominal: Soft.  There is no tenderness. Psychiatric: Patient has a normal mood and affect. behavior is normal. Judgment and thought content normal. Muscular Skeletal: pain during palpation of right metatarsal near mid foot, no redness or swelling.   PHQ2/9: [facility] 2/[facility] 04/16/2017 10/19/2016 12/09/2015 10/24/2015  Decreased Interest 0 0 0 0  Down, Depressed, Hopeless 0 0 0 0  PHQ - 2 Score 0 0 0 0     Fall Risk: Fall Risk  04/16/2017 10/19/2016 12/09/2015 10/24/2015  Falls in the past year? No No No No    Functional Status Survey: Is the patient deaf or have difficulty hearing?: No Does the patient have difficulty seeing, even when wearing glasses/contacts?: No Does the patient have difficulty  concentrating, remembering, or making decisions?: No Does the patient have difficulty walking or climbing stairs?: No Does the patient have difficulty dressing or bathing?: No Does the patient have difficulty doing errands alone such as visiting a doctor's office or shopping?: No   Assessment & Plan  1. Benign hypertension  He is not taking Atenolol, we will change to Metoprolol so it can last all day  - valsartan-hydrochlorothiazide (DIOVAN-HCT) 320-25 MG tablet; Take 1 tablet daily by mouth.  Dispense: 90 tablet; Refill: 1 - metoprolol succinate (TOPROL-XL) 25 MG 24 hr tablet; Take 0.5-1 tablets (12.5-25 mg total) every evening by mouth.  Dispense: 90 tablet; Refill: 1 - CBC with Differential/Platelet - COMPLETE METABOLIC PANEL WITH GFR  2. GAD (generalized anxiety disorder)  - ALPRAZolam (XANAX) 0.5 MG tablet; Take 1  tablet (0.5 mg total) 2 (two) times daily as needed by mouth for anxiety.  Dispense: 30 tablet; Refill: 0 - citalopram (CELEXA) 20 MG tablet; Take 1 tablet (20 mg total) daily by mouth.  Dispense: 90 tablet; Refill: 1  3. Lipid screening  - Lipid panel  4. Obesity (BMI 30-39.9)  Discussed with the patient the risk posed by an increased BMI. Discussed importance of portion control, calorie counting and at least 150 minutes of physical activity weekly. Avoid sweet beverages and drink more water. Eat at least 6 servings of fruit and vegetables daily   5. Need for Tdap vaccination  - Tdap vaccine greater than or equal to 7yo IM  6. Chronic foot pain, right  Point tenderness on lateral aspect of 4th metatarsal, advised x-ray or referral to podiatrist, avoid NSAID's and consider Tylenol  7. Acute pain of right wrist  - Uric acid  8. Screening for diabetes mellitus  - Hemoglobin A1c

## 2017-04-17 LAB — CBC WITH DIFFERENTIAL/PLATELET
Basophils Absolute: 31 cells/uL (ref 0–200)
Basophils Relative: 0.5 %
EOS ABS: 378 {cells}/uL (ref 15–500)
Eosinophils Relative: 6.1 %
HCT: 45.2 % (ref 38.5–50.0)
HEMOGLOBIN: 14.4 g/dL (ref 13.2–17.1)
Lymphs Abs: 1934 cells/uL (ref 850–3900)
MCH: 24.6 pg — AB (ref 27.0–33.0)
MCHC: 31.9 g/dL — ABNORMAL LOW (ref 32.0–36.0)
MCV: 77.1 fL — AB (ref 80.0–100.0)
MPV: 10.6 fL (ref 7.5–12.5)
Monocytes Relative: 6.5 %
Neutro Abs: 3453 cells/uL (ref 1500–7800)
Neutrophils Relative %: 55.7 %
PLATELETS: 273 10*3/uL (ref 140–400)
RBC: 5.86 10*6/uL — ABNORMAL HIGH (ref 4.20–5.80)
RDW: 13.9 % (ref 11.0–15.0)
TOTAL LYMPHOCYTE: 31.2 %
WBC: 6.2 10*3/uL (ref 3.8–10.8)
WBCMIX: 403 {cells}/uL (ref 200–950)

## 2017-04-17 LAB — COMPLETE METABOLIC PANEL WITH GFR
AG RATIO: 1.6 (calc) (ref 1.0–2.5)
ALBUMIN MSPROF: 4.2 g/dL (ref 3.6–5.1)
ALT: 19 U/L (ref 9–46)
AST: 19 U/L (ref 10–40)
Alkaline phosphatase (APISO): 69 U/L (ref 40–115)
BILIRUBIN TOTAL: 0.5 mg/dL (ref 0.2–1.2)
BUN: 14 mg/dL (ref 7–25)
CHLORIDE: 100 mmol/L (ref 98–110)
CO2: 31 mmol/L (ref 20–32)
Calcium: 9.6 mg/dL (ref 8.6–10.3)
Creat: 1.19 mg/dL (ref 0.60–1.35)
GFR, Est African American: 84 mL/min/{1.73_m2} (ref >=60)
GFR, Est Non African American: 72 mL/min/{1.73_m2} (ref >=60)
Globulin: 2.6 g/dL (calc) (ref 1.9–3.7)
Glucose, Bld: 168 mg/dL — ABNORMAL HIGH (ref 65–139)
POTASSIUM: 3.7 mmol/L (ref 3.5–5.3)
SODIUM: 138 mmol/L (ref 135–146)
Total Protein: 6.8 g/dL (ref 6.1–8.1)

## 2017-04-17 LAB — LIPID PANEL
CHOLESTEROL: 138 mg/dL (ref <200)
HDL: 46 mg/dL (ref >40)
LDL CHOLESTEROL (CALC): 71 mg/dL
Non-HDL Cholesterol (Calc): 92 mg/dL (calc) (ref <130)
Total CHOL/HDL Ratio: 3 (calc) (ref <5.0)
Triglycerides: 125 mg/dL (ref <150)

## 2017-04-17 LAB — HEMOGLOBIN A1C
HEMOGLOBIN A1C: 5.2 %{Hb} (ref <5.7)
MEAN PLASMA GLUCOSE: 103 (calc)
eAG (mmol/L): 5.7 (calc)

## 2017-04-17 LAB — URIC ACID: Uric Acid, Serum: 9.1 mg/dL — ABNORMAL HIGH (ref 4.0–8.0)

## 2017-04-18 ENCOUNTER — Encounter: Payer: Self-pay | Admitting: Family Medicine

## 2017-04-18 DIAGNOSIS — M109 Gout, unspecified: Secondary | ICD-10-CM | POA: Insufficient documentation

## 2017-04-19 ENCOUNTER — Other Ambulatory Visit: Payer: Self-pay | Admitting: Family Medicine

## 2017-04-19 MED ORDER — ALLOPURINOL 100 MG PO TABS: 100.0000 mg | ORAL_TABLET | Freq: Every day | ORAL | 2 refills | Status: DC

## 2017-10-18 ENCOUNTER — Encounter: Payer: Self-pay | Admitting: Family Medicine

## 2017-10-18 ENCOUNTER — Ambulatory Visit (INDEPENDENT_AMBULATORY_CARE_PROVIDER_SITE_OTHER): Payer: BLUE CROSS/BLUE SHIELD | Admitting: Family Medicine

## 2017-10-18 ENCOUNTER — Other Ambulatory Visit: Payer: Self-pay | Admitting: Family Medicine

## 2017-10-18 VITALS — BP 144/82 | HR 111 | Temp 98.3°F | Resp 18 | Ht 69.0 in | Wt 226.1 lb

## 2017-10-18 DIAGNOSIS — M109 Gout, unspecified: Secondary | ICD-10-CM | POA: Diagnosis not present

## 2017-10-18 DIAGNOSIS — R739 Hyperglycemia, unspecified: Secondary | ICD-10-CM

## 2017-10-18 DIAGNOSIS — F411 Generalized anxiety disorder: Secondary | ICD-10-CM | POA: Diagnosis not present

## 2017-10-18 DIAGNOSIS — E669 Obesity, unspecified: Secondary | ICD-10-CM | POA: Diagnosis not present

## 2017-10-18 DIAGNOSIS — I10 Essential (primary) hypertension: Secondary | ICD-10-CM | POA: Diagnosis not present

## 2017-10-18 MED ORDER — ALLOPURINOL 100 MG PO TABS
100.0000 mg | ORAL_TABLET | Freq: Every day | ORAL | 1 refills | Status: DC
Start: 2017-10-18 — End: 2018-10-17

## 2017-10-18 MED ORDER — ALPRAZOLAM 0.5 MG PO TABS: 0.5000 mg | ORAL_TABLET | Freq: Two times a day (BID) | ORAL | 0 refills | Status: DC | PRN

## 2017-10-18 MED ORDER — METOPROLOL SUCCINATE ER 25 MG PO TB24: 12.5000 mg | ORAL_TABLET | Freq: Every evening | ORAL | 1 refills | Status: DC

## 2017-10-18 MED ORDER — CITALOPRAM HYDROBROMIDE 20 MG PO TABS: 20.0000 mg | ORAL_TABLET | Freq: Every day | ORAL | 1 refills | Status: DC

## 2017-10-18 MED ORDER — VALSARTAN-HYDROCHLOROTHIAZIDE 320-25 MG PO TABS: 1.0000 | ORAL_TABLET | Freq: Every day | ORAL | 1 refills | Status: DC

## 2017-10-18 NOTE — Progress Notes (Signed)
Name: Johnathan Eaton   MRN: [id number]    DOB: 1970/11/25   Date:10/18/2017       Progress Note  Subjective  Chief Complaint  Chief Complaint  Patient presents with  . Medication Refill    6 month F/U  . Hypertension    Denies any symptoms  . Anxiety  . Hyperglycemia    HPI  HTN: he is taking Valsartan/HCTZ, discussed changing to ACE but he states he is afraid since uncle had angioedema. He never started Metoprolol and advised him to pick up since improves anxiety and also decrease heart rate. Marland Kitchen He denies chest pain or palpitation.   GERD: he symptoms of heartburn, taking otc medication occasionally, no change in bowel movement or blood in stools. He is taking aspirin, but calculated his risk without medication and he was advised to stop medication today   GAD: he is taking Citalopram, but about 4-5 times a week, forgets the other days, advised to take with bp medication.   Parents are now on a nursing home, both have dementia and he gets very anxious worrying about them.Still taking alprazolam prn. Father had a tooth abscess and is going back to nursing home tonight.   Obesity: his weight is stable,lost a few pounds since last visit, explained the importance of life style modification, he is still drinking sweet tea, lemonade and apple juice. Needs to change diet   Controlled gout: uric acid was high, not taking allopurinol daily but advised to take it daily and recheck level next visit   Hyperglycemia: he denies polyphagia, polydipsia or polyuria, last hgbA1C was normal     Patient Active Problem List   Diagnosis Date Noted  . Gout 04/18/2017  . Allergic rhinitis, seasonal 02/09/2015  . GAD (generalized anxiety disorder) 02/09/2015  . Benign hypertension 02/09/2015  . Blood glucose elevated 02/09/2015  . Gastric reflux 02/09/2015  . Obesity (BMI 30-39.9) 02/09/2015    Past Surgical History:  Procedure Laterality Date  . SHOULDER SURGERY Left    Rotator Cuff  Repair    Family History  Problem Relation Age of Onset  . Hypertension Mother   . Diabetes Mother   . Dementia Mother   . Lung cancer Mother        [name] through treatment  . Hypertension Father   . Dementia Father   . Hypertension Brother     Social History   Socioeconomic History  . Marital status: Single    Spouse name: Not on file  . Number of children: Not on file  . Years of education: Not on file  . Highest education level: Not on file  Occupational History  . Not on file  Social Needs  . Financial resource strain: Not on file  . Food insecurity:    Worry: Not on file    Inability: Not on file  . Transportation needs:    Medical: Not on file    Non-medical: Not on file  Tobacco Use  . Smoking status: Never Smoker  . Smokeless tobacco: Never Used  Substance and Sexual Activity  . Alcohol use: No    Alcohol/week: 0.0 oz  . Drug use: No  . Sexual activity: Yes    Partners: Female  Lifestyle  . Physical activity:    Days per week: Not on file    Minutes per session: Not on file  . Stress: Not on file  Relationships  . Social connections:    Talks on phone: Not on file  Gets together: Not on file    Attends religious service: Not on file    Active member of club or organization: Not on file    Attends meetings of clubs or organizations: Not on file    Relationship status: Not on file  . Intimate partner violence:    Fear of current or ex partner: Not on file    Emotionally abused: Not on file    Physically abused: Not on file    Forced sexual activity: Not on file  Other Topics Concern  . Not on file  Social History Narrative  . Not on file     Current Outpatient Medications:  .  allopurinol (ZYLOPRIM) 100 MG tablet, Take 1 tablet (100 mg total) daily by mouth., Disp: 60 tablet, Rfl: 2 .  ALPRAZolam (XANAX) 0.5 MG tablet, Take 1 tablet (0.5 mg total) 2 (two) times daily as needed by mouth for anxiety., Disp: 30 tablet, Rfl: 0 .  citalopram  (CELEXA) 20 MG tablet, Take 1 tablet (20 mg total) daily by mouth., Disp: 90 tablet, Rfl: 1 .  valsartan-hydrochlorothiazide (DIOVAN-HCT) 320-25 MG tablet, Take 1 tablet daily by mouth., Disp: 90 tablet, Rfl: 1 .  metoprolol succinate (TOPROL-XL) 25 MG 24 hr tablet, Take 0.5-1 tablets (12.5-25 mg total) every evening by mouth. (Patient not taking: Reported on 10/18/2017), Disp: 90 tablet, Rfl: 1 .  Selenium Sulfide 2.25 % SHAM, Apply 2.5 mLs topically 3 (three) times a week. (Patient not taking: Reported on 04/16/2017), Disp: 180 mL, Rfl: 2  No Known Allergies   ROS  Constitutional: Negative for fever or weight change.  Respiratory: Negative for cough and shortness of breath.   Cardiovascular: Negative for chest pain or palpitations.  Gastrointestinal: Negative for abdominal pain, no bowel changes.  Musculoskeletal: Negative for gait problem or joint swelling.  Skin: Negative for rash.  Neurological: Negative for dizziness or headache.  No other specific complaints in a complete review of systems (except as listed in HPI above).   Objective  Vitals:   10/18/17 1553  BP: (!) 144/82  Pulse: (!) 111  Resp: 18  Temp: 98.3 F (36.8 C)  TempSrc: Oral  SpO2: 97%  Weight: 226 lb 1.6 oz (102.6 kg)  Height:  (1.753 m)    Body mass index is 33.39 kg/m.  Physical Exam  Constitutional: Patient appears well-developed and well-nourished. Obese No distress.  HEENT: head atraumatic, normocephalic, pupils equal and reactive to light,  neck supple, throat within normal limits Cardiovascular: Normal rate, regular rhythm and normal heart sounds.  No murmur heard. No BLE edema. Pulmonary/Chest: Effort normal and breath sounds normal. No respiratory distress. Abdominal: Soft.  There is no tenderness. Psychiatric: Patient has a normal mood and affect. behavior is normal. Judgment and thought content normal.  PHQ2/9: Depression screen [facility] 2/9 10/18/2017 04/16/2017 10/19/2016 12/09/2015  10/24/2015  Decreased Interest 0 0 0 0 0  Down, Depressed, Hopeless 0 0 0 0 0  PHQ - 2 Score 0 0 0 0 0   GAD 7 : Generalized Anxiety Score 10/19/2016  Nervous, Anxious, on Edge 2  Control/stop worrying 0  Worry too much - different things 3  Trouble relaxing 3  Restless 0  Easily annoyed or irritable 1  Afraid - awful might happen 3  Total GAD 7 Score 12  Anxiety Difficulty Very difficult    Fall Risk: Fall Risk  10/18/2017 04/16/2017 10/19/2016 12/09/2015 10/24/2015  Falls in the past year? No No No No No  Functional Status Survey: Is the patient deaf or have difficulty hearing?: No Does the patient have difficulty seeing, even when wearing glasses/contacts?: Yes(prescription contacts) Does the patient have difficulty concentrating, remembering, or making decisions?: No Does the patient have difficulty walking or climbing stairs?: No Does the patient have difficulty dressing or bathing?: No Does the patient have difficulty doing errands alone such as visiting a doctor's office or shopping?: No    Assessment & Plan  1. Benign hypertension  He never filled toprol and advised him to get it today  - metoprolol succinate (TOPROL-XL) 25 MG 24 hr tablet; Take 0.5-1 tablets (12.5-25 mg total) by mouth every evening.  Dispense: 90 tablet; Refill: 1 - valsartan-hydrochlorothiazide (DIOVAN-HCT) 320-25 MG tablet; Take 1 tablet by mouth daily.  Dispense: 90 tablet; Refill: 1  2. GAD (generalized anxiety disorder)  - citalopram (CELEXA) 20 MG tablet; Take 1 tablet (20 mg total) by mouth daily.  Dispense: 90 tablet; Refill: 1 - ALPRAZolam (XANAX) 0.5 MG tablet; Take 1 tablet (0.5 mg total) by mouth 2 (two) times daily as needed for anxiety.  Dispense: 30 tablet; Refill: 0  3. Obesity (BMI 30-39.9)  Discussed with the patient the risk posed by an increased BMI. Discussed importance of portion control, calorie counting and at least 150 minutes of physical activity weekly. Avoid sweet  beverages and drink more water. Eat at least 6 servings of fruit and vegetables daily   4. Controlled gout  Needs to take medication daily, last uric acid was 9.1  - allopurinol (ZYLOPRIM) 100 MG tablet; Take 1 tablet (100 mg total) by mouth daily.  Dispense: 90 tablet; Refill: 1

## 2018-02-14 ENCOUNTER — Encounter: Payer: Self-pay | Admitting: Family Medicine

## 2018-04-05 ENCOUNTER — Other Ambulatory Visit: Payer: Self-pay | Admitting: Family Medicine

## 2018-04-05 DIAGNOSIS — I10 Essential (primary) hypertension: Secondary | ICD-10-CM

## 2018-04-18 ENCOUNTER — Encounter: Payer: Self-pay | Admitting: Family Medicine

## 2018-04-18 ENCOUNTER — Ambulatory Visit (INDEPENDENT_AMBULATORY_CARE_PROVIDER_SITE_OTHER): Payer: BLUE CROSS/BLUE SHIELD | Admitting: Family Medicine

## 2018-04-18 VITALS — BP 122/88 | HR 70 | Temp 98.4°F | Resp 16 | Ht 69.0 in | Wt 231.1 lb

## 2018-04-18 DIAGNOSIS — E669 Obesity, unspecified: Secondary | ICD-10-CM

## 2018-04-18 DIAGNOSIS — F411 Generalized anxiety disorder: Secondary | ICD-10-CM

## 2018-04-18 DIAGNOSIS — M109 Gout, unspecified: Secondary | ICD-10-CM

## 2018-04-18 DIAGNOSIS — K219 Gastro-esophageal reflux disease without esophagitis: Secondary | ICD-10-CM

## 2018-04-18 DIAGNOSIS — I10 Essential (primary) hypertension: Secondary | ICD-10-CM | POA: Diagnosis not present

## 2018-04-18 MED ORDER — HYDROXYZINE HCL 10 MG PO TABS: 10.0000 mg | ORAL_TABLET | Freq: Every day | ORAL | 0 refills | Status: DC | PRN

## 2018-04-18 MED ORDER — ALPRAZOLAM 0.5 MG PO TABS: 0.5000 mg | ORAL_TABLET | Freq: Two times a day (BID) | ORAL | 0 refills | Status: DC | PRN

## 2018-04-18 MED ORDER — COLCHICINE 0.6 MG PO TABS
0.6000 mg | ORAL_TABLET | Freq: Every day | ORAL | 0 refills | Status: DC | PRN
Start: 2018-04-18 — End: 2019-10-18

## 2018-04-18 MED ORDER — PANTOPRAZOLE SODIUM 40 MG PO TBEC: 40.0000 mg | DELAYED_RELEASE_TABLET | Freq: Every day | ORAL | 1 refills | Status: DC

## 2018-04-18 MED ORDER — CITALOPRAM HYDROBROMIDE 20 MG PO TABS
20.0000 mg | ORAL_TABLET | Freq: Every day | ORAL | 1 refills | Status: DC
Start: 2018-04-18 — End: 2019-01-10

## 2018-04-18 MED ORDER — METOPROLOL SUCCINATE ER 25 MG PO TB24
12.5000 mg | ORAL_TABLET | Freq: Every evening | ORAL | 1 refills | Status: DC
Start: 2018-04-18 — End: 2018-10-17

## 2018-04-18 MED ORDER — VALSARTAN-HYDROCHLOROTHIAZIDE 320-25 MG PO TABS: 1.0000 | ORAL_TABLET | Freq: Every day | ORAL | 1 refills | Status: DC

## 2018-04-18 NOTE — Progress Notes (Signed)
Name: Johnathan Eaton   MRN: [id number]    DOB: [birth year]   Date:04/18/2018       Progress Note  Subjective  Chief Complaint  Chief Complaint  Patient presents with  . Gastroesophageal Reflux  . Anxiety  . Hypertension    HPI  HTN: he is taking Valsartan/HCTZ, discussed changing to ACE but he states he is afraid since uncle had angioedema.He is still taking metoprolol at night. He denies chest pain or palpitation.   GERD: he states symptoms are much worse now, he states waking up with severe regurgitation and has to vomit in am's, symptoms not as severe during the day. He is taking Pepci prn, we will try resuming PPI, discussed long term medication risk again.    GAD:he has not been taking Citalopram daily, he states stopped because it decreases his libido, his GAD 7 is elevated, explained we can change medication or he needs to take it daily for 2 months and see if side effects subsided.Parents are now on a nursing home, both have dementia and he gets very anxious worrying about them.Still taking alprazolam prn and asked for extra pills, explained we can add hydroxyzine, but needs to take citalopram to improve symptoms.   Obesity: his weight has gone up since last visit. Explained the importance of life style modification, he is still drinking sweet tea, lemonade and apple juice.Needs to change diet to be able to lose weight.   Controlled gout: uric acid was high, not taking allopurinol daily , he states had two flares recently with pain and swelling of right big toe, we will give him prn medication also   Hyperglycemia: he denies polyphagia, polydipsia or polyuria, last hgbA1C was normal  . He wants to recheck labs during his CPE next month    Patient Active Problem List   Diagnosis Date Noted  . Gout 04/18/2017  . Allergic rhinitis, seasonal 02/09/2015  . GAD (generalized anxiety disorder) 02/09/2015  . Benign hypertension 02/09/2015  . Blood glucose elevated  02/09/2015  . Gastric reflux 02/09/2015  . Obesity (BMI 30-39.9) 02/09/2015    Past Surgical History:  Procedure Laterality Date  . SHOULDER SURGERY Left    Rotator Cuff Repair    Family History  Problem Relation Age of Onset  . Hypertension Mother   . Diabetes Mother   . Dementia Mother   . Lung cancer Mother        Micah FlesherWent through treatment  . Hypertension Father   . Dementia Father   . Hypertension Brother     Social History   Socioeconomic History  . Marital status: Single    Spouse name: Not on file  . Number of children: Not on file  . Years of education: Not on file  . Highest education level: Not on file  Occupational History  . Not on file  Social Needs  . Financial resource strain: Not on file  . Food insecurity:    Worry: Not on file    Inability: Not on file  . Transportation needs:    Medical: Not on file    Non-medical: Not on file  Tobacco Use  . Smoking status: Never Smoker  . Smokeless tobacco: Never Used  Substance and Sexual Activity  . Alcohol use: No    Alcohol/week: 0.0 standard drinks  . Drug use: No  . Sexual activity: Yes    Partners: Female  Lifestyle  . Physical activity:    Days per week: Not on file  Minutes per session: Not on file  . Stress: Not on file  Relationships  . Social connections:    Talks on phone: Not on file    Gets together: Not on file    Attends religious service: Not on file    Active member of club or organization: Not on file    Attends meetings of clubs or organizations: Not on file    Relationship status: Not on file  . Intimate partner violence:    Fear of current or ex partner: Not on file    Emotionally abused: Not on file    Physically abused: Not on file    Forced sexual activity: Not on file  Other Topics Concern  . Not on file  Social History Narrative  . Not on file     Current Outpatient Medications:  .  allopurinol (ZYLOPRIM) 100 MG tablet, Take 1 tablet (100 mg total) by mouth  daily., Disp: 90 tablet, Rfl: 1 .  ALPRAZolam (XANAX) 0.5 MG tablet, Take 1 tablet (0.5 mg total) by mouth 2 (two) times daily as needed for anxiety., Disp: 30 tablet, Rfl: 0 .  citalopram (CELEXA) 20 MG tablet, Take 1 tablet (20 mg total) by mouth daily., Disp: 90 tablet, Rfl: 1 .  colchicine 0.6 MG tablet, Take 1 tablet (0.6 mg total) by mouth daily as needed. Take two at onset of gout and may repeat times 1 in one hour , prn, Disp: 30 tablet, Rfl: 0 .  hydrOXYzine (ATARAX/VISTARIL) 10 MG tablet, Take 1 tablet (10 mg total) by mouth daily as needed., Disp: 30 tablet, Rfl: 0 .  metoprolol succinate (TOPROL-XL) 25 MG 24 hr tablet, Take 0.5-1 tablets (12.5-25 mg total) by mouth every evening., Disp: 90 tablet, Rfl: 1 .  pantoprazole (PROTONIX) 40 MG tablet, Take 1 tablet (40 mg total) by mouth daily before supper., Disp: 90 tablet, Rfl: 1 .  valsartan-hydrochlorothiazide (DIOVAN-HCT) 320-25 MG tablet, Take 1 tablet by mouth daily., Disp: 90 tablet, Rfl: 1  No Known Allergies  I personally reviewed active problem list, medication list, allergies, family history, social history with the patient/caregiver today.   ROS  Constitutional: Negative for fever or weight change.  Respiratory: Negative for cough and shortness of breath.   Cardiovascular: Negative for chest pain or palpitations.  Gastrointestinal: Negative for abdominal pain, no bowel changes.  Musculoskeletal: Negative for gait problem or joint swelling.  Skin: Negative for rash.  Neurological: Negative for dizziness or headache.  No other specific complaints in a complete review of systems (except as listed in HPI above).  Objective  Vitals:   04/18/18 1630  BP: 122/88  Pulse: 70  Resp: 16  Temp: 98.4 F (36.9 C)  TempSrc: Oral  SpO2: 97%  Weight: 231 lb 1.6 oz (104.8 kg)  Height: 5\' 9"  (1.753 m)    Body mass index is 34.13 kg/m.  Physical Exam  Constitutional: Patient appears well-developed and well-nourished. Obese   No distress.  HEENT: head atraumatic, normocephalic, pupils equal and reactive to light,  neck supple, throat within normal limits Cardiovascular: Normal rate, regular rhythm and normal heart sounds.  No murmur heard. No BLE edema. Pulmonary/Chest: Effort normal and breath sounds normal. No respiratory distress. Abdominal: Soft.  There is no tenderness. Psychiatric: Patient has a normal mood and affect. behavior is normal. Judgment and thought content normal.   PHQ2/9: Depression screen [facility] 2/9 10/18/2017 04/16/2017 10/19/2016 12/09/2015 10/24/2015  Decreased Interest 0 0 0 0 0  Down, Depressed, Hopeless 0 0  0 0 0  PHQ - 2 Score 0 0 0 0 0     GAD 7 : Generalized Anxiety Score 10/19/2016  Nervous, Anxious, on Edge 2  Control/stop worrying 0  Worry too much - different things 3  Trouble relaxing 3  Restless 0  Easily annoyed or irritable 1  Afraid - awful might happen 3  Total GAD 7 Score 12  Anxiety Difficulty Very difficult    Fall Risk: Fall Risk  10/18/2017 04/16/2017 10/19/2016 12/09/2015 10/24/2015  Falls in the past year? No No No No No    Assessment & Plan  1. Benign hypertension  - metoprolol succinate (TOPROL-XL) 25 MG 24 hr tablet; Take 0.5-1 tablets (12.5-25 mg total) by mouth every evening.  Dispense: 90 tablet; Refill: 1 - valsartan-hydrochlorothiazide (DIOVAN-HCT) 320-25 MG tablet; Take 1 tablet by mouth daily.  Dispense: 90 tablet; Refill: 1  2. Obesity (BMI 30-39.9)  Discussed with the patient the risk posed by an increased BMI. Discussed importance of portion control, calorie counting and at least 150 minutes of physical activity weekly. Avoid sweet beverages and drink more water. Eat at least 6 servings of fruit and vegetables daily   3. Controlled gout  - colchicine 0.6 MG tablet; Take 1 tablet (0.6 mg total) by mouth daily as needed. Take two at onset of gout and may repeat times 1 in one hour , prn  Dispense: 30 tablet; Refill: 0  4. GAD (generalized  anxiety disorder)  He is not taking Citalopram as prescribed, explained that if he takes it daily for 2 months side effects of medication will improve and subside,  he asked for more xanax, but explained cannot give more since he just comes in every 6 months, he is willing to try hydroxyzine also  - ALPRAZolam (XANAX) 0.5 MG tablet; Take 1 tablet (0.5 mg total) by mouth 2 (two) times daily as needed for anxiety.  Dispense: 30 tablet; Refill: 0 - citalopram (CELEXA) 20 MG tablet; Take 1 tablet (20 mg total) by mouth daily.  Dispense: 90 tablet; Refill: 1 - hydrOXYzine (ATARAX/VISTARIL) 10 MG tablet; Take 1 tablet (10 mg total) by mouth daily as needed.  Dispense: 30 tablet; Refill: 0  5. GERD without esophagitis  - pantoprazole (PROTONIX) 40 MG tablet; Take 1 tablet (40 mg total) by mouth daily before supper.  Dispense: 90 tablet; Refill: 1

## 2018-04-20 ENCOUNTER — Other Ambulatory Visit: Payer: Self-pay | Admitting: Family Medicine

## 2018-04-20 DIAGNOSIS — F411 Generalized anxiety disorder: Secondary | ICD-10-CM

## 2018-04-22 ENCOUNTER — Other Ambulatory Visit: Payer: Self-pay | Admitting: Family Medicine

## 2018-04-22 DIAGNOSIS — F411 Generalized anxiety disorder: Secondary | ICD-10-CM

## 2018-05-10 ENCOUNTER — Encounter: Payer: Self-pay | Admitting: Family Medicine

## 2018-10-17 ENCOUNTER — Encounter: Payer: Self-pay | Admitting: Family Medicine

## 2018-10-17 ENCOUNTER — Ambulatory Visit (INDEPENDENT_AMBULATORY_CARE_PROVIDER_SITE_OTHER): Payer: BLUE CROSS/BLUE SHIELD | Admitting: Family Medicine

## 2018-10-17 VITALS — Ht 69.0 in | Wt 226.0 lb

## 2018-10-17 DIAGNOSIS — K219 Gastro-esophageal reflux disease without esophagitis: Secondary | ICD-10-CM

## 2018-10-17 DIAGNOSIS — R739 Hyperglycemia, unspecified: Secondary | ICD-10-CM

## 2018-10-17 DIAGNOSIS — F411 Generalized anxiety disorder: Secondary | ICD-10-CM | POA: Diagnosis not present

## 2018-10-17 DIAGNOSIS — M109 Gout, unspecified: Secondary | ICD-10-CM

## 2018-10-17 DIAGNOSIS — I10 Essential (primary) hypertension: Secondary | ICD-10-CM

## 2018-10-17 MED ORDER — ALPRAZOLAM 0.5 MG PO TABS: 0.5000 mg | ORAL_TABLET | Freq: Two times a day (BID) | ORAL | 0 refills | Status: DC | PRN

## 2018-10-17 MED ORDER — AMLODIPINE BESYLATE-VALSARTAN 5-160 MG PO TABS: 1.0000 | ORAL_TABLET | Freq: Every day | ORAL | 0 refills | Status: DC

## 2018-10-17 MED ORDER — HYDROXYZINE HCL 10 MG PO TABS: 10.0000 mg | ORAL_TABLET | Freq: Every day | ORAL | 0 refills | Status: DC | PRN

## 2018-10-17 MED ORDER — PANTOPRAZOLE SODIUM 40 MG PO TBEC: 40.0000 mg | DELAYED_RELEASE_TABLET | Freq: Every day | ORAL | 0 refills | Status: DC

## 2018-10-17 NOTE — Progress Notes (Signed)
Name: Johnathan Eaton   MRN: [id number]    DOB: Oct 27, 1970   Date:10/17/2018       Progress Note  Subjective  Chief Complaint  Chief Complaint  Patient presents with  . Medication Refill  . Hypertension    Denies any symptoms  . Gastroesophageal Reflux    Has been bothered him and would like to discuss medication   . Obesity  . Gout    Had a flair up in his small toe a couple of week sago   . Anxiety    I connected with  Johnathan Eaton  on 10/17/18 at  3:40 PM EDT by a telephone encounter, since video call did work.  I am speaking with the correct person using two identifiers.  I discussed the limitations of evaluation and management by telemedicine and the availability of in person appointments. The patient expressed understanding and agreed to proceed. Staff also discussed with the patient that there may be a patient responsible charge related to this service. Patient Location: at home Provider Location: [facility] Additional Individuals present: wife  HPI  HTN: he is taking Valsartan/HCTZ, discussed changing to ACE but he states he is afraid since uncle had angioedema.He is still taking metoprolol at night. He denies chest pain or palpitation. It was a virtual visit but advised him to return tomorrow for labs and bp check , but he states he needs to give one week noticed to his employer so we will try to bring him back for a CPE. BP has been at goal at home around 120's/80's. We will stop hctz because of cough and try Exforge 5/160 and stop metoprolol   GERD: he states symptoms controlled with PPI, but is not taking it daily, he would like a refill. Two weeks ago he was heartburn and indigestion   GAD:he has not been taking Citalopram daily, he states stopped because it decreases his libido and makes him sleepy during the day.  He states doing well on hydroxyzine and alprazolam prn and  He has been worried about his parents getting COVID-19    Controlled gout: uric acid was high, not taking allopurinol daily , he has about one episode that lasts a few days every 4 months, we will stop hctz.   Hyperglycemia: he denies polyphagia, polydipsia or polyuria,last hgbA1C was normal. We will recheck labs when he return in a few weeks for a CPE  Patient Active Problem List   Diagnosis Date Noted  . Gout 04/18/2017  . Allergic rhinitis, seasonal 02/09/2015  . GAD (generalized anxiety disorder) 02/09/2015  . Benign hypertension 02/09/2015  . Blood glucose elevated 02/09/2015  . Gastric reflux 02/09/2015  . Obesity (BMI 30-39.9) 02/09/2015    Past Surgical History:  Procedure Laterality Date  . SHOULDER SURGERY Left    Rotator Cuff Repair    Family History  Problem Relation Age of Onset  . Hypertension Mother   . Diabetes Mother   . Dementia Mother   . Lung cancer Mother        Johnathan Eaton through treatment  . Hypertension Father   . Dementia Father   . Hypertension Brother     Social History   Socioeconomic History  . Marital status: Single    Spouse name: Not on file  . Number of children: Not on file  . Years of education: Not on file  . Highest education level: Not on file  Occupational History  . Not on file  Social Needs  .  Financial resource strain: Not on file  . Food insecurity:    Worry: Not on file    Inability: Not on file  . Transportation needs:    Medical: Not on file    Non-medical: Not on file  Tobacco Use  . Smoking status: Never Smoker  . Smokeless tobacco: Never Used  Substance and Sexual Activity  . Alcohol use: No    Alcohol/week: 0.0 standard drinks  . Drug use: No  . Sexual activity: Yes    Partners: Female  Lifestyle  . Physical activity:    Days per week: Not on file    Minutes per session: Not on file  . Stress: Not on file  Relationships  . Social connections:    Talks on phone: Not on file    Gets together: Not on file    Attends religious service: Not on file    Active  member of club or organization: Not on file    Attends meetings of clubs or organizations: Not on file    Relationship status: Not on file  . Intimate partner violence:    Fear of current or ex partner: Not on file    Emotionally abused: Not on file    Physically abused: Not on file    Forced sexual activity: Not on file  Other Topics Concern  . Not on file  Social History Narrative  . Not on file     Current Outpatient Medications:  .  allopurinol (ZYLOPRIM) 100 MG tablet, Take 1 tablet (100 mg total) by mouth daily., Disp: 90 tablet, Rfl: 1 .  ALPRAZolam (XANAX) 0.5 MG tablet, Take 1 tablet (0.5 mg total) by mouth 2 (two) times daily as needed for anxiety., Disp: 30 tablet, Rfl: 0 .  citalopram (CELEXA) 20 MG tablet, Take 1 tablet (20 mg total) by mouth daily., Disp: 90 tablet, Rfl: 1 .  colchicine 0.6 MG tablet, Take 1 tablet (0.6 mg total) by mouth daily as needed. Take two at onset of gout and may repeat times 1 in one hour , prn, Disp: 30 tablet, Rfl: 0 .  hydrOXYzine (ATARAX/VISTARIL) 10 MG tablet, Take 1 tablet (10 mg total) by mouth daily as needed., Disp: 30 tablet, Rfl: 0 .  metoprolol succinate (TOPROL-XL) 25 MG 24 hr tablet, Take 0.5-1 tablets (12.5-25 mg total) by mouth every evening., Disp: 90 tablet, Rfl: 1 .  valsartan-hydrochlorothiazide (DIOVAN-HCT) 320-25 MG tablet, Take 1 tablet by mouth daily., Disp: 90 tablet, Rfl: 1 .  pantoprazole (PROTONIX) 40 MG tablet, Take 1 tablet (40 mg total) by mouth daily before supper. (Patient not taking: Reported on 10/17/2018), Disp: 90 tablet, Rfl: 1  No Known Allergies  I personally reviewed active problem list, medication list, allergies, family history, social history with the patient/caregiver today.   ROS   Ten systems reviewed and is negative except as mentioned in HPI  Objective  Virtual encounter, vitals not obtained.  Body mass index is 33.37 kg/m.  Physical Exam  Awake, alert and oriented   PHQ2/9:  Depression screen [facility] 2/9 10/17/2018 10/18/2017 04/16/2017 10/19/2016 12/09/2015  Decreased Interest 0 0 0 0 0  Down, Depressed, Hopeless 0 0 0 0 0  PHQ - 2 Score 0 0 0 0 0  Altered sleeping 1 - - - -  Tired, decreased energy 0 - - - -  Change in appetite 0 - - - -  Feeling bad or failure about yourself  0 - - - -  Trouble concentrating 1 - - - -  Moving slowly or fidgety/restless 1 - - - -  Suicidal thoughts 0 - - - -  PHQ-9 Score 3 - - - -  Difficult doing work/chores Not difficult at all - - - -   PHQ-2/9 Result is negative.    GAD 7 : Generalized Anxiety Score 10/17/2018 10/19/2016  Nervous, Anxious, on Edge 1 2  Control/stop worrying 1 0  Worry too much - different things 1 3  Trouble relaxing 0 3  Restless 0 0  Easily annoyed or irritable 1 1  Afraid - awful might happen 0 3  Total GAD 7 Score 4 12  Anxiety Difficulty Somewhat difficult Very difficult     Fall Risk: Fall Risk  10/17/2018 04/18/2018 10/18/2017 04/16/2017 10/19/2016  Falls in the past year? 0 1 No No No  Number falls in past yr: 0 1 - - -  Injury with Fall? 0 0 - - -     Assessment & Plan  1. GAD (generalized anxiety disorder)  - ALPRAZolam (XANAX) 0.5 MG tablet; Take 1 tablet (0.5 mg total) by mouth 2 (two) times daily as needed for anxiety.  Dispense: 30 tablet; Refill: 0 - hydrOXYzine (ATARAX/VISTARIL) 10 MG tablet; Take 1 tablet (10 mg total) by mouth daily as needed.  Dispense: 30 tablet; Refill: 0  2. Benign hypertension  - amLODipine-valsartan (EXFORGE) 5-160 MG tablet; Take 1 tablet by mouth daily.  Dispense: 30 tablet; Refill: 0  3. Controlled gout  Not taking Allopurinol, stop HCTZ   4. GERD without esophagitis  - pantoprazole (PROTONIX) 40 MG tablet; Take 1 tablet (40 mg total) by mouth daily before supper.  Dispense: 30 tablet; Refill: 0  I discussed the assessment and treatment plan with the patient. The patient was provided an opportunity to ask questions and all were answered. The  patient agreed with the plan and demonstrated an understanding of the instructions.  The patient was advised to call back or seek an in-person evaluation if the symptoms worsen or if the condition fails to improve as anticipated.  I provided 25  minutes of non-face-to-face time during this encounter.

## 2018-11-03 ENCOUNTER — Telehealth: Payer: Self-pay

## 2018-11-03 NOTE — Telephone Encounter (Signed)
Pt.notified

## 2018-11-03 NOTE — Telephone Encounter (Signed)
He states will have to come at later date for a1c

## 2018-11-03 NOTE — Telephone Encounter (Signed)
Copied from CRM [id number]. Topic: Appointment Scheduling - Scheduling Inquiry for Clinic >> Nov 02, 2018  3:18 PM Wyonia Hough E wrote: Reason for CRM: Pt has a CPE scheduled for 6.5.20 and doesn't feel comfortable coming into the office. Pt would like Dr Carlynn Purl nurse to call him about this matter/ please advise

## 2018-11-04 ENCOUNTER — Encounter: Payer: BLUE CROSS/BLUE SHIELD | Admitting: Family Medicine

## 2018-12-07 ENCOUNTER — Other Ambulatory Visit: Payer: Self-pay | Admitting: Family Medicine

## 2018-12-15 ENCOUNTER — Other Ambulatory Visit: Payer: Self-pay

## 2019-01-02 ENCOUNTER — Telehealth: Payer: Self-pay | Admitting: Family Medicine

## 2019-01-02 NOTE — Telephone Encounter (Signed)
Pt stated Dr. Ancil Boozer changed his blood pressure medication and now it is 3x as expensive. He would like for Dr. Ancil Boozer assistant to return his call regarding this.

## 2019-01-03 NOTE — Telephone Encounter (Signed)
appt scheduled

## 2019-01-08 ENCOUNTER — Other Ambulatory Visit: Payer: Self-pay | Admitting: Family Medicine

## 2019-01-08 DIAGNOSIS — I10 Essential (primary) hypertension: Secondary | ICD-10-CM

## 2019-01-10 ENCOUNTER — Ambulatory Visit (INDEPENDENT_AMBULATORY_CARE_PROVIDER_SITE_OTHER): Payer: BC Managed Care – PPO | Admitting: Family Medicine

## 2019-01-10 ENCOUNTER — Encounter: Payer: Self-pay | Admitting: Family Medicine

## 2019-01-10 DIAGNOSIS — R739 Hyperglycemia, unspecified: Secondary | ICD-10-CM | POA: Diagnosis not present

## 2019-01-10 DIAGNOSIS — I10 Essential (primary) hypertension: Secondary | ICD-10-CM | POA: Diagnosis not present

## 2019-01-10 DIAGNOSIS — K219 Gastro-esophageal reflux disease without esophagitis: Secondary | ICD-10-CM

## 2019-01-10 DIAGNOSIS — Z1159 Encounter for screening for other viral diseases: Secondary | ICD-10-CM

## 2019-01-10 DIAGNOSIS — M109 Gout, unspecified: Secondary | ICD-10-CM

## 2019-01-10 DIAGNOSIS — F411 Generalized anxiety disorder: Secondary | ICD-10-CM | POA: Diagnosis not present

## 2019-01-10 MED ORDER — AMLODIPINE BESYLATE-VALSARTAN 5-160 MG PO TABS: 1.0000 | ORAL_TABLET | Freq: Every day | ORAL | 1 refills | Status: DC

## 2019-01-10 MED ORDER — HYDROXYZINE HCL 10 MG PO TABS: 10.0000 mg | ORAL_TABLET | Freq: Every day | ORAL | 1 refills | Status: DC | PRN

## 2019-01-10 MED ORDER — PANTOPRAZOLE SODIUM 20 MG PO TBEC: 20.0000 mg | DELAYED_RELEASE_TABLET | Freq: Every day | ORAL | 1 refills | Status: DC

## 2019-01-10 MED ORDER — ALPRAZOLAM 0.5 MG PO TABS: 0.5000 mg | ORAL_TABLET | Freq: Two times a day (BID) | ORAL | 0 refills | Status: DC | PRN

## 2019-01-10 NOTE — Progress Notes (Signed)
Name: Johnathan Eaton   MRN: [id number]    DOB: 1971-05-19   Date:01/10/2019       Progress Note  Subjective  Chief Complaint  Chief Complaint  Patient presents with  . Hypertension    needs a cheaper bp medication. Current meds are $70.  . Medication Refill  . Anxiety    Needs a medication refill.    I connected with  Johnathan Eaton  on 01/10/19 at 11:20 AM EDT by a telephone encounter  and verified that I am speaking with the correct person using two identifiers.  I discussed the limitations of evaluation and management by telemedicine and the availability of in person appointments. The patient expressed understanding and agreed to proceed. Staff also discussed with the patient that there may be a patient responsible charge related to this service. Patient Location: he is at [employer] in [place], parked  Provider Location: [facility]   HPI  HTN: he was  taking Valsartan/HCTZ, but in May we changed to Exforge since he has a history of gout - eliminated HCTZ. He denies chest pain or palpitation. He was not able to return for bp check and labs as recommended during his last visit.  BP has been at goal but not sure of the values . No side effects of new medications  GERD:he states symptoms controlled with PPI, taking half pill every day now  GMW:NUUVO not beentaking Citalopramdaily, he states stopped because it decreases his libido and makes him sleepy during the day.  He states doing well on hydroxyzine and alprazolam prn and  He has been worried about his parents getting COVID-19, he is still very concerned about them   Controlled gout: uric acid was high, he needs to return for labs since we stopped HCTZ no episodes   Hyperglycemia: he denies polyphagia, polydipsia or polyuria,last hgbA1C was normalbut is doe for follow up and repeat labs since it has been over one year  Patient Active Problem List   Diagnosis Date Noted  . Gout  04/18/2017  . Allergic rhinitis, seasonal 02/09/2015  . GAD (generalized anxiety disorder) 02/09/2015  . Benign hypertension 02/09/2015  . Blood glucose elevated 02/09/2015  . Gastric reflux 02/09/2015  . Obesity (BMI 30-39.9) 02/09/2015    Past Surgical History:  Procedure Laterality Date  . SHOULDER SURGERY Left    Rotator Cuff Repair    Family History  Problem Relation Age of Onset  . Hypertension Mother   . Diabetes Mother   . Dementia Mother   . Lung cancer Mother        Martin Majestic through treatment  . Hypertension Father   . Dementia Father   . Hypertension Brother     Social History   Socioeconomic History  . Marital status: Married    Spouse name: [name]   . Number of children: 1  . Years of education: Not on file  . Highest education level: 12th grade  Occupational History  . 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 Financial resource strain: Not hard at all  . Food insecurity    Worry: Never true    Inability: Never true  . Transportation needs    Medical: No    Non-medical: No  Tobacco Use  . Smoking status: Never Smoker  . Smokeless tobacco: Never Used  Substance and Sexual Activity  . Alcohol use: No    Alcohol/week: 0.0 standard drinks  . Drug use: No  . Sexual activity:  Yes    Partners: Female  Lifestyle  . Physical activity    Days per week: 5 days    Minutes per session: 150+ min  . Stress: Only a little  Relationships  . Social connections    Talks on phone: More than three times a week    Gets together: Three times a week    Attends religious service: More than 4 times per year    Active member of club or organization: No    Attends meetings of clubs or organizations: Never    Relationship status: Married  . Intimate partner violence    Fear of current or ex partner: No    Emotionally abused: No    Physically abused: No    Forced sexual activity: No  Other Topics Concern  . Not on file  Social History Narrative  . Not on  file     Current Outpatient Medications:  .  ALPRAZolam (XANAX) 0.5 MG tablet, Take 1 tablet (0.5 mg total) by mouth 2 (two) times daily as needed for anxiety., Disp: 30 tablet, Rfl: 0 .  amLODipine-valsartan (EXFORGE) 5-160 MG tablet, TAKE 1 TABLET BY MOUTH EVERY DAY, Disp: 30 tablet, Rfl: 0 .  citalopram (CELEXA) 20 MG tablet, Take 1 tablet (20 mg total) by mouth daily., Disp: 90 tablet, Rfl: 1 .  colchicine 0.6 MG tablet, Take 1 tablet (0.6 mg total) by mouth daily as needed. Take two at onset of gout and may repeat times 1 in one hour , prn, Disp: 30 tablet, Rfl: 0 .  hydrOXYzine (ATARAX/VISTARIL) 10 MG tablet, Take 1 tablet (10 mg total) by mouth daily as needed., Disp: 30 tablet, Rfl: 0 .  pantoprazole (PROTONIX) 40 MG tablet, Take 1 tablet (40 mg total) by mouth daily before supper., Disp: 30 tablet, Rfl: 0  No Known Allergies  I personally reviewed active problem list, medication list, allergies, family history, social history with the patient/caregiver today.   ROS  Constitutional: Negative for fever or weight change.  Respiratory: Negative for cough and shortness of breath.   Cardiovascular: Negative for chest pain or palpitations.  Gastrointestinal: Negative for abdominal pain, no bowel changes.  Musculoskeletal: Negative for gait problem or joint swelling.  Skin: Negative for rash.  Neurological: Negative for dizziness or headache.  No other specific complaints in a complete review of systems (except as listed in HPI above).  Objective  Virtual encounter, vitals not obtained.  There is no height or weight on file to calculate BMI.  Physical Exam  Awake, alert and oriented  PHQ2/9: [name] 01/10/2019 10/17/2018 10/18/2017 04/16/2017 10/19/2016  Decreased Interest 0 0 0 0 0  Down, Depressed, Hopeless 1 0 0 0 0  PHQ - 2 Score 1 0 0 0 0  Altered sleeping 0 1 - - -  Tired, decreased energy 1 0 - - -  Change in appetite 0 0 - - -  Feeling bad or  failure about yourself  0 0 - - -  Trouble concentrating 0 1 - - -  Moving slowly or fidgety/restless 0 1 - - -  Suicidal thoughts 0 0 - - -  PHQ-9 Score 2 3 - - -  Difficult doing work/chores Not difficult at all Not difficult at all - - -   PHQ-2/9 Result is positive.    Fall Risk: Fall Risk  10/17/2018 04/18/2018 10/18/2017 04/16/2017 10/19/2016  Falls in the past year? 0 1 No No No  Number falls in past  yr: 0 1 - - -  Injury with Fall? 0 0 - - -    Assessment & Plan  1. GAD (generalized anxiety disorder)  - ALPRAZolam (XANAX) 0.5 MG tablet; Take 1 tablet (0.5 mg total) by mouth 2 (two) times daily as needed for anxiety.  Dispense: 30 tablet; Refill: 0 - hydrOXYzine (ATARAX/VISTARIL) 10 MG tablet; Take 1 tablet (10 mg total) by mouth daily as needed.  Dispense: 90 tablet; Refill: 1  2. Benign hypertension  - amLODipine-valsartan (EXFORGE) 5-160 MG tablet; Take 1 tablet by mouth daily.  Dispense: 90 tablet; Refill: 1 - CBC with Differential/Platelet - COMPLETE METABOLIC PANEL WITH GFR  3. GERD without esophagitis  - pantoprazole (PROTONIX) 20 MG tablet; Take 1 tablet (20 mg total) by mouth daily before supper.  Dispense: 90 tablet; Refill: 1  4. Hyperglycemia  - Hemoglobin A1c  5. Controlled gout  - Lipid panel - Uric acid  6. Need for hepatitis C screening test  - Hepatitis C antibody  I discussed the assessment and treatment plan with the patient. The patient was provided an opportunity to ask questions and all were answered. The patient agreed with the plan and demonstrated an understanding of the instructions.  The patient was advised to call back or seek an in-person evaluation if the symptoms worsen or if the condition fails to improve as anticipated.  I provided 25 minutes of non-face-to-face time during this encounter.

## 2019-06-15 ENCOUNTER — Other Ambulatory Visit: Payer: Self-pay

## 2019-06-15 ENCOUNTER — Ambulatory Visit: Payer: BC Managed Care – PPO

## 2019-06-15 DIAGNOSIS — M109 Gout, unspecified: Secondary | ICD-10-CM | POA: Diagnosis not present

## 2019-06-15 DIAGNOSIS — R739 Hyperglycemia, unspecified: Secondary | ICD-10-CM | POA: Diagnosis not present

## 2019-06-15 DIAGNOSIS — Z1159 Encounter for screening for other viral diseases: Secondary | ICD-10-CM | POA: Diagnosis not present

## 2019-06-15 DIAGNOSIS — I10 Essential (primary) hypertension: Secondary | ICD-10-CM | POA: Diagnosis not present

## 2019-06-16 LAB — CBC WITH DIFFERENTIAL/PLATELET
Absolute Monocytes: 508 cells/uL (ref 200–950)
Basophils Absolute: 49 cells/uL (ref 0–200)
Basophils Relative: 0.9 %
Eosinophils Absolute: 405 cells/uL (ref 15–500)
Eosinophils Relative: 7.5 %
HCT: 46.2 % (ref 38.5–50.0)
Hemoglobin: 14.8 g/dL (ref 13.2–17.1)
Lymphs Abs: 1766 cells/uL (ref 850–3900)
MCH: 24.6 pg — ABNORMAL LOW (ref 27.0–33.0)
MCHC: 32 g/dL (ref 32.0–36.0)
MCV: 76.7 fL — ABNORMAL LOW (ref 80.0–100.0)
MPV: 10.3 fL (ref 7.5–12.5)
Monocytes Relative: 9.4 %
Neutro Abs: 2673 cells/uL (ref 1500–7800)
Neutrophils Relative %: 49.5 %
Platelets: 276 10*3/uL (ref 140–400)
RBC: 6.02 10*6/uL — ABNORMAL HIGH (ref 4.20–5.80)
RDW: 14.2 % (ref 11.0–15.0)
Total Lymphocyte: 32.7 %
WBC: 5.4 10*3/uL (ref 3.8–10.8)

## 2019-06-16 LAB — LIPID PANEL
Cholesterol: 147 mg/dL (ref <200)
HDL: 45 mg/dL (ref >=40)
LDL Cholesterol (Calc): 87 mg/dL (calc)
Non-HDL Cholesterol (Calc): 102 mg/dL (calc) (ref <130)
Total CHOL/HDL Ratio: 3.3 (calc) (ref <5.0)
Triglycerides: 60 mg/dL (ref <150)

## 2019-06-16 LAB — COMPLETE METABOLIC PANEL WITH GFR
AG Ratio: 1.7 (calc) (ref 1.0–2.5)
ALT: 18 U/L (ref 9–46)
AST: 18 U/L (ref 10–40)
Albumin: 4.5 g/dL (ref 3.6–5.1)
Alkaline phosphatase (APISO): 84 U/L (ref 36–130)
BUN: 15 mg/dL (ref 7–25)
CO2: 32 mmol/L (ref 20–32)
Calcium: 10.1 mg/dL (ref 8.6–10.3)
Chloride: 101 mmol/L (ref 98–110)
Creat: 1.1 mg/dL (ref 0.60–1.35)
GFR, Est African American: 91 mL/min/{1.73_m2} (ref >=60)
GFR, Est Non African American: 78 mL/min/{1.73_m2} (ref >=60)
Globulin: 2.7 g/dL (calc) (ref 1.9–3.7)
Glucose, Bld: 108 mg/dL — ABNORMAL HIGH (ref 65–99)
Potassium: 4.2 mmol/L (ref 3.5–5.3)
Sodium: 139 mmol/L (ref 135–146)
Total Bilirubin: 0.8 mg/dL (ref 0.2–1.2)
Total Protein: 7.2 g/dL (ref 6.1–8.1)

## 2019-06-16 LAB — HEPATITIS C ANTIBODY
Hepatitis C Ab: NONREACTIVE
SIGNAL TO CUT-OFF: 0.02 (ref <1.00)

## 2019-06-16 LAB — URIC ACID: Uric Acid, Serum: 7.6 mg/dL (ref 4.0–8.0)

## 2019-06-16 LAB — HEMOGLOBIN A1C
Hgb A1c MFr Bld: 5.2 % of total Hgb (ref <5.7)
Mean Plasma Glucose: 103 (calc)
eAG (mmol/L): 5.7 (calc)

## 2019-06-19 ENCOUNTER — Telehealth: Payer: Self-pay

## 2019-06-21 ENCOUNTER — Ambulatory Visit: Payer: BC Managed Care – PPO | Attending: Internal Medicine

## 2019-06-21 DIAGNOSIS — Z20822 Contact with and (suspected) exposure to covid-19: Secondary | ICD-10-CM | POA: Insufficient documentation

## 2019-06-22 ENCOUNTER — Telehealth: Payer: Self-pay

## 2019-06-22 LAB — NOVEL CORONAVIRUS, NAA: SARS-CoV-2, NAA: NOT DETECTED

## 2019-06-22 NOTE — Telephone Encounter (Signed)
Copied from CRM [id number]. Topic: General - Inquiry >> Jun 22, 2019 11:52 AM Lynne Logan D wrote: Reason for CRM: Pt called to request a note from Dr. Carlynn Purl for his employer stating pt came into the office on 06/15/19 to have lab work done. Requesting it to be sent to his MyChart. Please advise.

## 2019-06-23 ENCOUNTER — Encounter: Payer: Self-pay | Admitting: Family Medicine

## 2019-06-23 ENCOUNTER — Ambulatory Visit: Payer: BC Managed Care – PPO | Attending: Internal Medicine

## 2019-06-23 DIAGNOSIS — Z20822 Contact with and (suspected) exposure to covid-19: Secondary | ICD-10-CM | POA: Insufficient documentation

## 2019-06-23 NOTE — Telephone Encounter (Signed)
Letter written and sent to patient MyChart.

## 2019-06-24 LAB — NOVEL CORONAVIRUS, NAA: SARS-CoV-2, NAA: NOT DETECTED

## 2019-06-26 ENCOUNTER — Other Ambulatory Visit: Payer: Self-pay

## 2019-09-06 ENCOUNTER — Other Ambulatory Visit: Payer: Self-pay | Admitting: Family Medicine

## 2019-09-06 DIAGNOSIS — I10 Essential (primary) hypertension: Secondary | ICD-10-CM

## 2019-09-06 NOTE — Telephone Encounter (Signed)
Requested Prescriptions  Pending Prescriptions Disp Refills  . amLODipine-valsartan (EXFORGE) 5-160 MG tablet [Pharmacy Med Name: AMLODIPINE-VALSARTAN 5-160 MG] 30 tablet 0    Sig: TAKE 1 TABLET BY MOUTH EVERY DAY     Cardiovascular: CCB + ARB Combos Failed - 09/06/2019  2:18 AM      Failed - Valid encounter within last 6 months    Recent Outpatient Visits          7 months ago Hyperglycemia   [facility], [facility]   10 months ago Hyperglycemia   [facility] Alba Cory, MD   1 year ago Benign hypertension   [facility] Alba Cory, MD   1 year ago Benign hypertension   [facility], Danna Hefty, MD   2 years ago Benign hypertension   [facility] [place], Danna Hefty, MD             Passed - K in normal range and within 180 days    Potassium  Date Value Ref Range Status  06/15/2019 4.2 3.5 - 5.3 mmol/L Final         Passed - Cr in normal range and within 180 days    Creat  Date Value Ref Range Status  06/15/2019 1.10 0.60 - 1.35 mg/dL Final         Passed - Patient is not pregnant      Passed - Last BP in normal range    BP Readings from Last 1 Encounters:  04/18/18 122/88

## 2019-10-06 ENCOUNTER — Other Ambulatory Visit: Payer: Self-pay | Admitting: Family Medicine

## 2019-10-06 DIAGNOSIS — I10 Essential (primary) hypertension: Secondary | ICD-10-CM

## 2019-10-06 NOTE — Telephone Encounter (Signed)
Requested  medications are  due for refill today yes  Requested medications are on the active medication list yes  Last refill 4/7  Future visit scheduled no  Notes to clinic Failed protocol due to no visit within 6 months

## 2019-10-06 NOTE — Telephone Encounter (Signed)
MB is full. Pt needs an appt for refills per dr Carlynn Purl

## 2019-10-17 ENCOUNTER — Other Ambulatory Visit: Payer: Self-pay | Admitting: Family Medicine

## 2019-10-17 DIAGNOSIS — I10 Essential (primary) hypertension: Secondary | ICD-10-CM

## 2019-10-18 ENCOUNTER — Encounter: Payer: Self-pay | Admitting: Family Medicine

## 2019-10-18 ENCOUNTER — Ambulatory Visit (INDEPENDENT_AMBULATORY_CARE_PROVIDER_SITE_OTHER): Payer: BC Managed Care – PPO | Admitting: Family Medicine

## 2019-10-18 ENCOUNTER — Other Ambulatory Visit: Payer: Self-pay

## 2019-10-18 DIAGNOSIS — R739 Hyperglycemia, unspecified: Secondary | ICD-10-CM

## 2019-10-18 DIAGNOSIS — M109 Gout, unspecified: Secondary | ICD-10-CM | POA: Diagnosis not present

## 2019-10-18 DIAGNOSIS — F411 Generalized anxiety disorder: Secondary | ICD-10-CM

## 2019-10-18 DIAGNOSIS — K219 Gastro-esophageal reflux disease without esophagitis: Secondary | ICD-10-CM | POA: Diagnosis not present

## 2019-10-18 DIAGNOSIS — I10 Essential (primary) hypertension: Secondary | ICD-10-CM

## 2019-10-18 MED ORDER — HYDROXYZINE HCL 10 MG PO TABS: 10.0000 mg | ORAL_TABLET | Freq: Every day | ORAL | 1 refills | Status: DC | PRN

## 2019-10-18 MED ORDER — PANTOPRAZOLE SODIUM 20 MG PO TBEC: 20.0000 mg | DELAYED_RELEASE_TABLET | Freq: Every day | ORAL | 1 refills | Status: DC

## 2019-10-18 MED ORDER — COLCHICINE 0.6 MG PO TABS: 0.6000 mg | ORAL_TABLET | Freq: Every day | ORAL | 0 refills | Status: DC | PRN

## 2019-10-18 MED ORDER — AMLODIPINE BESYLATE-VALSARTAN 5-160 MG PO TABS: 1.0000 | ORAL_TABLET | Freq: Every day | ORAL | 1 refills | Status: DC

## 2019-10-18 MED ORDER — ALPRAZOLAM 0.5 MG PO TABS: 0.5000 mg | ORAL_TABLET | Freq: Two times a day (BID) | ORAL | 0 refills | Status: DC | PRN

## 2019-10-18 NOTE — Progress Notes (Signed)
Name: Johnathan Eaton   MRN: [id number]    DOB: 11-29-1970   Date:10/18/2019       Progress Note  Subjective  Chief Complaint  Chief Complaint  Patient presents with  . Hypertension    medical refills and follow up  . Anxiety    I connected with  Johnathan Eaton on 10/18/19 at  1:40 PM EDT by telephone and verified that I am speaking with the correct person using two identifiers.  I discussed the limitations, risks, security and privacy concerns of performing an evaluation and management service by telephone and the availability of in person appointments. Staff also discussed with the patient that there may be a patient responsible charge related to this service. Patient Location: at work  Provider Location: [facility]   HPI  HTN: he was  taking Valsartan/HCTZ, but in May we changed to [place] since he has a history of gout - eliminated HCTZ. He denies chest pain or palpitation. He states his bp at home has been  " good " but he is not sure of values, explained importance of a face to face visit with me and a CPE. No chest pain or palpitation   GERD:he statessymptoms controlled with PPI, taking half pill every day now.He has occasional symptoms when he does not follow a GERD diet   GAD:he states he is back on Citalopram since his mother died in 08/09/22 , he had stopped because it decreases his libidoand makes him sleepy during the day. He is still taking hydroxyzine and alprazolam   Controlled gout: uric acid was high, he needs to return for labs since we stopped HCTZ. He states had a recent flare on left knee , he needs refill of colchicine   Hyperglycemia: he denies polyphagia, polydipsia or polyuria,last hgbA1C was normal 06/15/2019   Patient Active Problem List   Diagnosis Date Noted  . Gout 04/18/2017  . Allergic rhinitis, seasonal 02/09/2015  . GAD (generalized anxiety disorder) 02/09/2015  . Benign hypertension 02/09/2015  . Blood glucose elevated 02/09/2015  .  Gastric reflux 02/09/2015  . Obesity (BMI 30-39.9) 02/09/2015    Past Surgical History:  Procedure Laterality Date  . SHOULDER SURGERY Left    Rotator Cuff Repair    Family History  Problem Relation Age of Onset  . Hypertension Mother   . Diabetes Mother   . Dementia Mother   . Lung cancer Mother        Martin Majestic through treatment  . Pneumonia Mother        covid complications   . Hypertension Father   . Dementia Father   . Hypertension Brother     Social History   Socioeconomic History  . Marital status: Married    Spouse name: [name]   . Number of children: 1  . Years of education: Not on file  . Highest education level: 12th grade  Occupational History  . 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 Smoking status: Never Smoker  . Smokeless tobacco: Never Used  Substance and Sexual Activity  . Alcohol use: No    Alcohol/week: 0.0 standard drinks  . Drug use: No  . Sexual activity: Yes    Partners: Female  Other Topics Concern  . Not on file  Social History Narrative  . Not on file   Social Determinants of Health   Financial Resource Strain: Low Risk   . Difficulty of Paying Living Expenses: Not hard at all  Food Insecurity: No Food  Insecurity  . Worried About Programme researcher, broadcasting/film/video in the Last Year: Never true  . Ran Out of Food in the Last Year: Never true  Transportation Needs: No Transportation Needs  . Lack of Transportation (Medical): No  . Lack of Transportation (Non-Medical): No  Physical Activity: Sufficiently Active  . Days of Exercise per Week: 5 days  . Minutes of Exercise per Session: 150+ min  Stress: No Stress Concern Present  . Feeling of Stress : Only a little  Social Connections: Slightly Isolated  . Frequency of Communication with Friends and Family: More than three times a week  . Frequency of Social Gatherings with Friends and Family: Three times a week  . Attends Religious Services: More than 4 times per year  . Active Member of  Clubs or Organizations: No  . Attends Banker Meetings: Never  . Marital Status: Married  Catering manager Violence: Not At Risk  . Fear of Current or Ex-Partner: No  . Emotionally Abused: No  . Physically Abused: No  . Sexually Abused: No     Current Outpatient Medications:  .  ALPRAZolam (XANAX) 0.5 MG tablet, Take 1 tablet (0.5 mg total) by mouth 2 (two) times daily as needed for anxiety., Disp: 30 tablet, Rfl: 0 .  amLODipine-valsartan (EXFORGE) 5-160 MG tablet, Take 1 tablet by mouth daily., Disp: 90 tablet, Rfl: 1 .  colchicine 0.6 MG tablet, Take 1 tablet (0.6 mg total) by mouth daily as needed. Take two at onset of gout and may repeat times 1 in one hour , prn, Disp: 30 tablet, Rfl: 0 .  hydrOXYzine (ATARAX/VISTARIL) 10 MG tablet, Take 1 tablet (10 mg total) by mouth daily as needed., Disp: 90 tablet, Rfl: 1 .  pantoprazole (PROTONIX) 20 MG tablet, Take 1 tablet (20 mg total) by mouth daily before supper., Disp: 90 tablet, Rfl: 1  No Known Allergies  I personally reviewed active problem list, medication list, allergies, family history, social history, health maintenance, notes from last encounter with the patient/caregiver today.   ROS  Constitutional: Negative for fever or weight change.  Respiratory: Negative for cough and shortness of breath.   Cardiovascular: Negative for chest pain or palpitations.  Gastrointestinal: Negative for abdominal pain, no bowel changes.  Musculoskeletal: Negative for gait problem or joint swelling.  Skin: Negative for rash.  Neurological: Negative for dizziness or headache.  No other specific complaints in a complete review of systems (except as listed in HPI above).  Objective  Virtual encounter, vitals not obtained.  There is no height or weight on file to calculate BMI.  Physical Exam  Awake, alert and oriented  PHQ2/9: Depression screen [facility] 2/9 10/18/2019 01/10/2019 10/17/2018 10/18/2017 04/16/2017  Decreased Interest  0 0 0 0 0  Down, Depressed, Hopeless 0 1 0 0 0  PHQ - 2 Score 0 1 0 0 0  Altered sleeping 0 0 1 - -  Tired, decreased energy 0 1 0 - -  Change in appetite 0 0 0 - -  Feeling bad or failure about yourself  0 0 0 - -  Trouble concentrating 0 0 1 - -  Moving slowly or fidgety/restless 0 0 1 - -  Suicidal thoughts 0 0 0 - -  PHQ-9 Score 0 2 3 - -  Difficult doing work/chores Not difficult at all Not difficult at all Not difficult at all - -   PHQ-2/9 Result is negative.    Fall Risk: Fall Risk  10/18/2019 10/17/2018 04/18/2018  10/18/2017 04/16/2017  Falls in the past year? 0 0 1 No No  Number falls in past yr: 0 0 1 - -  Injury with Fall? 0 0 0 - -  Follow up Falls evaluation completed - - - -     Assessment & Plan  1. GAD (generalized anxiety disorder)  - hydrOXYzine (ATARAX/VISTARIL) 10 MG tablet; Take 1 tablet (10 mg total) by mouth daily as needed.  Dispense: 90 tablet; Refill: 1 - ALPRAZolam (XANAX) 0.5 MG tablet; Take 1 tablet (0.5 mg total) by mouth 2 (two) times daily as needed for anxiety.  Dispense: 30 tablet; Refill: 0  2. Benign hypertension  - amLODipine-valsartan (EXFORGE) 5-160 MG tablet; Take 1 tablet by mouth daily.  Dispense: 90 tablet; Refill: 1  3. GERD without esophagitis  - pantoprazole (PROTONIX) 20 MG tablet; Take 1 tablet (20 mg total) by mouth daily before supper.  Dispense: 90 tablet; Refill: 1  4. Controlled gout  - colchicine 0.6 MG tablet; Take 1 tablet (0.6 mg total) by mouth daily as needed. Take two at onset of gout and may repeat times 1 in one hour , prn  Dispense: 30 tablet; Refill: 0  5. Hyperglycemia   I discussed the assessment and treatment plan with the patient. The patient was provided an opportunity to ask questions and all were answered. The patient agreed with the plan and demonstrated an understanding of the instructions.   The patient was advised to call back or seek an in-person evaluation if the symptoms worsen or if the  condition fails to improve as anticipated.  I provided 25  minutes of non-face-to-face time during this encounter.  Ruel Favors, MD

## 2020-03-03 DIAGNOSIS — Z6832 Body mass index (BMI) 32.0-32.9, adult: Secondary | ICD-10-CM | POA: Diagnosis not present

## 2020-03-03 DIAGNOSIS — R0981 Nasal congestion: Secondary | ICD-10-CM | POA: Diagnosis not present

## 2020-03-04 NOTE — Progress Notes (Signed)
Name: Johnathan Eaton   MRN: [id number]    DOB: [birth year]   Date:03/05/2020       Progress Note  Subjective:    Chief Complaint  Chief Complaint  Patient presents with  . Sinusitis    green mucous, drainage for 8 days    I connected with  Gracy Racer  on 03/05/20 at  8:20 AM EDT by a video enabled telemedicine application and verified that I am speaking with the correct person using two identifiers.  I discussed the limitations of evaluation and management by telemedicine and the availability of in person appointments. The patient expressed understanding and agreed to proceed. Staff also discussed with the patient that there may be a patient responsible charge related to this service. Patient Location:  home Provider Location: [facility] clinic office  Additional Individuals present:   HPI Pt presents with illness, sx started 8 days ago, got tested and evaluated 2 days ago  at urgent care at [facility].  Through mychart encounter was reviewed - COVID test was positive  Sx started as presumed seasonal allergies, he had sinus congestion and pressure with a lot of mucous production.  UC gave him steroids Mild associated stomach discomfort His sx have been improving No cough SOB CP fever sweats chills  He denies myalgias, facial/sinus pain, HA, fatigue   Patient Active Problem List   Diagnosis Date Noted  . Gout 04/18/2017  . Allergic rhinitis, seasonal 02/09/2015  . GAD (generalized anxiety disorder) 02/09/2015  . Benign hypertension 02/09/2015  . Blood glucose elevated 02/09/2015  . Gastric reflux 02/09/2015  . Obesity (BMI 30-39.9) 02/09/2015    Social History   Tobacco Use  . Smoking status: Never Smoker  . Smokeless tobacco: Never Used  Substance Use Topics  . Alcohol use: No    Alcohol/week: 0.0 standard drinks     Current Outpatient Medications:  .  ALPRAZolam (XANAX) 0.5 MG tablet, Take 1 tablet (0.5 mg total) by mouth 2 (two) times daily as needed for anxiety.,  Disp: 30 tablet, Rfl: 0 .  amLODipine-valsartan (EXFORGE) 5-160 MG tablet, Take 1 tablet by mouth daily., Disp: 90 tablet, Rfl: 1 .  colchicine 0.6 MG tablet, Take 1 tablet (0.6 mg total) by mouth daily as needed. Take two at onset of gout and may repeat times 1 in one hour , prn, Disp: 30 tablet, Rfl: 0 .  hydrOXYzine (ATARAX/VISTARIL) 10 MG tablet, Take 1 tablet (10 mg total) by mouth daily as needed., Disp: 90 tablet, Rfl: 1 .  pantoprazole (PROTONIX) 20 MG tablet, Take 1 tablet (20 mg total) by mouth daily before supper., Disp: 90 tablet, Rfl: 1  No Known Allergies  I personally reviewed active problem list, medication list, allergies, family history, social history, health maintenance, notes from last encounter, lab results, imaging with the patient/caregiver today.   Review of Systems  10 Systems reviewed and are negative for acute change except as noted in the HPI.   Objective:   Virtual encounter, vitals limited, only able to obtain the following There were no vitals filed for this visit. There is no height or weight on file to calculate BMI. Nursing Note and Vital Signs reviewed.  Physical Exam Vitals and nursing note reviewed.  Constitutional:      General: He is not in acute distress.    Appearance: Normal appearance. He is obese. He is not ill-appearing, toxic-appearing or diaphoretic.  HENT:     Nose:     Comments: Sounds congested, normal  appearing Eyes:     General:        Right eye: No discharge.        Left eye: No discharge.     Conjunctiva/sclera: Conjunctivae normal.  Pulmonary:     Effort: Pulmonary effort is normal. No respiratory distress.  Neurological:     Mental Status: He is alert.  Psychiatric:        Mood and Affect: Mood normal.        Behavior: Behavior normal.     PE limited by telephone encounter  No results found for this or any previous visit (from the past 72 hour(s)).  Assessment and Plan:     ICD-10-CM   1. Upper respiratory  tract infection due to COVID-19 virus  U07.1    J06.9    rhinosinusitis sx that are improving, no fever, no cough/sob/sweats/cp/fatigue - isolation to end 10/7 back to work 10/8    Encouraged supportive and sx tx - rest, fluids, nasal sprays, OTC decongestants and/or antihistamines.  -Red flags and when to present for emergency care or RTC including chest pain, shortness of breath, new/worsening/un-resolving symptoms, reviewed with patient at time of visit. Follow up and care instructions discussed and provided in AVS. Reviewed COVID isolation and end of isolation criteria. Pt wanted a work note until 03/11/2020, however he also stated he was doing much better.  Wrote note for isolation period ending according to his hx of onset of illness - day 10 03/07/2020 and back to work 03/08/2020 - explained that if he did not fit criteria (ie developed new sx or concerns) to let us know and we could reconsider extending absence from work.  - I discussed the assessment and treatment plan with the patient. The patient was provided an opportunity to ask questions and all were answered. The patient agreed with the plan and demonstrated an understanding of the instructions.  I provided 20+ minutes of non-face-to-face time during this encounter.  Danelle Berry, PA-C 03/05/20 8:55 AM

## 2020-03-05 ENCOUNTER — Telehealth (INDEPENDENT_AMBULATORY_CARE_PROVIDER_SITE_OTHER): Payer: BC Managed Care – PPO | Admitting: Family Medicine

## 2020-03-05 ENCOUNTER — Other Ambulatory Visit: Payer: Self-pay

## 2020-03-05 ENCOUNTER — Encounter: Payer: Self-pay | Admitting: Family Medicine

## 2020-03-05 DIAGNOSIS — U071 COVID-19: Secondary | ICD-10-CM

## 2020-03-05 DIAGNOSIS — J069 Acute upper respiratory infection, unspecified: Secondary | ICD-10-CM

## 2020-03-14 ENCOUNTER — Other Ambulatory Visit: Payer: Self-pay

## 2020-03-14 DIAGNOSIS — Z20822 Contact with and (suspected) exposure to covid-19: Secondary | ICD-10-CM

## 2020-03-15 LAB — SARS-COV-2, NAA 2 DAY TAT

## 2020-03-15 LAB — NOVEL CORONAVIRUS, NAA: SARS-CoV-2, NAA: NOT DETECTED

## 2020-04-17 ENCOUNTER — Other Ambulatory Visit: Payer: Self-pay | Admitting: Family Medicine

## 2020-04-17 DIAGNOSIS — I10 Essential (primary) hypertension: Secondary | ICD-10-CM

## 2020-05-15 ENCOUNTER — Other Ambulatory Visit: Payer: Self-pay | Admitting: Family Medicine

## 2020-05-15 DIAGNOSIS — I10 Essential (primary) hypertension: Secondary | ICD-10-CM

## 2020-05-16 NOTE — Progress Notes (Signed)
Name: Johnathan Eaton   MRN: [id number]    DOB: 1971-02-28   Date:05/17/2020       Progress Note  Subjective  Chief Complaint  Follow up   HPI   HTN: he was  taking Valsartan/HCTZ, but  we changed to [place] since he has a history of gout - eliminated HCTZ. He denies chest tightness or palpitation ( unless he has a panic attack) . He states bp at home has been in the mid 130's.   GERD:he states he was doing well , however over the past couple of months he has noticed symptoms no longer controlled, taking Pantoprazole 20 mg most days of the week but has epigastric burning, regurgitation and bloating. He is due for colonoscopy. We will increase dose of pantoprazole and refer to GI. He denies change in appetite, blood in stools or vomiting. His weight is down but had COVID-19 in October and weight was down to 208 lbs and is gradually gaining his weight back   GAD:he states he resumed  Citalopram when his mother died 2019-08-09, but it  decreases his libidoand makes him sleepy during the day therefore he  is off medication again . He is still taking hydroxyzine and alprazolam prn and needs refills.   Controlled gout: uric acid level improve when he stopped HCTZ, he still has flares about every 9 months , and resolves with colchicine but takes a few days , he does not want preventive medication at this time  Hyperglycemia: he denies polyphagia, polydipsia or polyuria,last hgbA1C was normal 06/15/2019  We will recheck prior to next visit    The 10-year ASCVD risk score Denman George DC Jr., et al., 2013) is: 9.2%   Values used to calculate the score:     Age: 29 years     Sex: Male     Is Non-Hispanic African American: Yes     Diabetic: No     Tobacco smoker: No     Systolic Blood Pressure: 136 mmHg     Is BP treated: Yes     HDL Cholesterol: 45 mg/dL     Total Cholesterol: 147 mg/dL   We will monitor for now, but consider statin if LDL goes above 10 %  Patient Active Problem List    Diagnosis Date Noted  . Gout 04/18/2017  . Allergic rhinitis, seasonal 02/09/2015  . GAD (generalized anxiety disorder) 02/09/2015  . Benign hypertension 02/09/2015  . Blood glucose elevated 02/09/2015  . Gastric reflux 02/09/2015  . Obesity (BMI 30-39.9) 02/09/2015    Past Surgical History:  Procedure Laterality Date  . SHOULDER SURGERY Left    Rotator Cuff Repair    Family History  Problem Relation Age of Onset  . Hypertension Mother   . Diabetes Mother   . Dementia Mother   . Lung cancer Mother        [name] through treatment  . Pneumonia Mother        covid complications   . Hypertension Father   . Dementia Father   . Hypertension Brother     Social History   Tobacco Use  . Smoking status: Never Smoker  . Smokeless tobacco: Never Used  Substance Use Topics  . Alcohol use: No    Alcohol/week: 0.0 standard drinks     Current Outpatient Medications:  .  ALPRAZolam (XANAX) 0.5 MG tablet, Take 1 tablet (0.5 mg total) by mouth 2 (two) times daily as needed for anxiety., Disp: 30 tablet, Rfl: 0 .  amLODipine-valsartan (EXFORGE) 5-160 MG tablet, Take 1 tablet by mouth daily., Disp: 90 tablet, Rfl: 1 .  colchicine 0.6 MG tablet, Take 1 tablet (0.6 mg total) by mouth daily as needed. Take two at onset of gout and may repeat times 1 in one hour , prn, Disp: 30 tablet, Rfl: 0 .  hydrOXYzine (ATARAX/VISTARIL) 10 MG tablet, Take 1 tablet (10 mg total) by mouth daily as needed., Disp: 90 tablet, Rfl: 1 .  pantoprazole (PROTONIX) 40 MG tablet, Take 1 tablet (40 mg total) by mouth daily before supper., Disp: 90 tablet, Rfl: 1  No Known Allergies  I personally reviewed active problem list, medication list, allergies, family history, social history, health maintenance, notes from last encounter with the patient/caregiver today.   ROS  Constitutional: Negative for fever or weight change.  Respiratory: Negative for cough and shortness of breath.   Cardiovascular: Negative for  chest pain or palpitations.  Gastrointestinal: Negative for abdominal pain, no bowel changes.  Musculoskeletal: Negative for gait problem or joint swelling.  Skin: Negative for rash.  Neurological: Negative for dizziness or headache.  No other specific complaints in a complete review of systems (except as listed in HPI above).  Objective  Vitals:   05/17/20 1332  BP: 136/82  Pulse: 96  Resp: 18  Temp: 98 F (36.7 C)  TempSrc: Oral  SpO2: 96%  Weight: 220 lb 12.8 oz (100.2 kg)  Height: 5\' 9"  (1.753 m)    Body mass index is 32.61 kg/m.  Physical Exam  Constitutional: Patient appears well-developed and well-nourished. Obese No distress.  HEENT: head atraumatic, normocephalic, pupils equal and reactive to light,  neck supple Cardiovascular: Normal rate, regular rhythm and normal heart sounds.  No murmur heard. No BLE edema. Pulmonary/Chest: Effort normal and breath sounds normal. No respiratory distress. Abdominal: Soft.  There is no tenderness. Psychiatric: Patient has a normal mood and affect. behavior is normal. Judgment and thought content normal.  Recent Results (from the past 2160 hour(s))  Novel Coronavirus, NAA ([facility])     Status: None   Collection Time: 03/14/20  6:10 PM   Specimen: Oropharyngeal(OP) collection in vial transport medium   Oropharyngea  Is this  Result Value Ref Range   SARS-CoV-2, NAA Not Detected Not Detected    Comment: This nucleic acid amplification test was developed and its performance characteristics determined by 03/16/20. Nucleic acid amplification tests include RT-PCR and TMA. This test has not been FDA cleared or approved. This test has been authorized by FDA under an Emergency Use Authorization (EUA). This test is only authorized for the duration of time the declaration that circumstances exist justifying the authorization of the emergency use of in vitro diagnostic tests for detection of SARS-CoV-2 virus and/or  diagnosis of COVID-19 infection under section 564(b)(1) of the Act, 21 U.S.C. [employer]) (1), unless the authorization is terminated or revoked sooner. When diagnostic testing is negative, the possibility of a false negative result should be considered in the context of a patient's recent exposures and the presence of clinical signs and symptoms consistent with COVID-19. An individual without symptoms of COVID-19 and who is not shedding SARS-CoV-2 virus wo uld expect to have a negative (not detected) result in this assay.   SARS-COV-2, NAA 2 DAY TAT     Status: None   Collection Time: 03/14/20  6:10 PM   Oropharyngea  Is this  Result Value Ref Range   SARS-CoV-2, NAA 2 DAY TAT Performed      PHQ2/9: Depression  screen [facility] 2/9 05/17/2020 03/05/2020 10/18/2019 01/10/2019 10/17/2018  Decreased Interest 0 0 0 0 0  Down, Depressed, Hopeless 0 0 0 1 0  PHQ - 2 Score 0 0 0 1 0  Altered sleeping 1 - 0 0 1  Tired, decreased energy 1 - 0 1 0  Change in appetite 1 - 0 0 0  Feeling bad or failure about yourself  1 - 0 0 0  Trouble concentrating 1 - 0 0 1  Moving slowly or fidgety/restless 1 - 0 0 1  Suicidal thoughts 0 - 0 0 0  PHQ-9 Score 6 - 0 2 3  Difficult doing work/chores Somewhat difficult - Not difficult at all Not difficult at all Not difficult at all    phq 9 is positive - he feels guilty that his father is in a nursing home    Fall Risk: Fall Risk  05/17/2020 03/05/2020 10/18/2019 10/17/2018 04/18/2018  Falls in the past year? 1 0 0 0 1  Number falls in past yr: 1 0 0 0 1  Injury with Fall? 0 0 0 0 0  Risk for fall due to : History of fall(s) - - - -  Follow up - Falls evaluation completed Falls evaluation completed - -     Functional Status Survey: Is the patient deaf or have difficulty hearing?: No Does the patient have difficulty seeing, even when wearing glasses/contacts?: Yes Does the patient have difficulty concentrating, remembering, or making decisions?: No Does the  patient have difficulty walking or climbing stairs?: No Does the patient have difficulty dressing or bathing?: No Does the patient have difficulty doing errands alone such as visiting a doctor's office or shopping?: No   Assessment & Plan   1. Colon cancer screening  - Ambulatory referral to Gastroenterology  2. GAD (generalized anxiety disorder)  - ALPRAZolam (XANAX) 0.5 MG tablet; Take 1 tablet (0.5 mg total) by mouth 2 (two) times daily as needed for anxiety.  Dispense: 30 tablet; Refill: 0 - hydrOXYzine (ATARAX/VISTARIL) 10 MG tablet; Take 1 tablet (10 mg total) by mouth daily as needed.  Dispense: 90 tablet; Refill: 1  3. Benign hypertension  - amLODipine-valsartan (EXFORGE) 5-160 MG tablet; Take 1 tablet by mouth daily.  Dispense: 90 tablet; Refill: 1 - CBC with Differential/Platelet - COMPLETE METABOLIC PANEL WITH GFR  4. GERD without esophagitis  - pantoprazole (PROTONIX) 40 MG tablet; Take 1 tablet (40 mg total) by mouth daily before supper.  Dispense: 90 tablet; Refill: 1  5. Controlled gout  - colchicine 0.6 MG tablet; Take 1 tablet (0.6 mg total) by mouth daily as needed. Take two at onset of gout and may repeat times 1 in one hour , prn  Dispense: 30 tablet; Refill: 0 - Uric acid  6. Hyperglycemia  - Hemoglobin A1c  7. Lipid screening  - Lipid panel

## 2020-05-17 ENCOUNTER — Encounter: Payer: Self-pay | Admitting: Family Medicine

## 2020-05-17 ENCOUNTER — Ambulatory Visit (INDEPENDENT_AMBULATORY_CARE_PROVIDER_SITE_OTHER): Payer: BC Managed Care – PPO | Admitting: Family Medicine

## 2020-05-17 ENCOUNTER — Other Ambulatory Visit: Payer: Self-pay

## 2020-05-17 VITALS — BP 136/82 | HR 96 | Temp 98.0°F | Resp 18 | Ht 69.0 in | Wt 220.8 lb

## 2020-05-17 DIAGNOSIS — Z13220 Encounter for screening for lipoid disorders: Secondary | ICD-10-CM

## 2020-05-17 DIAGNOSIS — Z1211 Encounter for screening for malignant neoplasm of colon: Secondary | ICD-10-CM | POA: Diagnosis not present

## 2020-05-17 DIAGNOSIS — K219 Gastro-esophageal reflux disease without esophagitis: Secondary | ICD-10-CM | POA: Diagnosis not present

## 2020-05-17 DIAGNOSIS — I10 Essential (primary) hypertension: Secondary | ICD-10-CM | POA: Diagnosis not present

## 2020-05-17 DIAGNOSIS — F411 Generalized anxiety disorder: Secondary | ICD-10-CM

## 2020-05-17 DIAGNOSIS — M109 Gout, unspecified: Secondary | ICD-10-CM

## 2020-05-17 DIAGNOSIS — R739 Hyperglycemia, unspecified: Secondary | ICD-10-CM

## 2020-05-17 MED ORDER — AMLODIPINE BESYLATE-VALSARTAN 5-160 MG PO TABS: 1.0000 | ORAL_TABLET | Freq: Every day | ORAL | 1 refills | Status: DC

## 2020-05-17 MED ORDER — PANTOPRAZOLE SODIUM 40 MG PO TBEC
40.0000 mg | DELAYED_RELEASE_TABLET | Freq: Every day | ORAL | 1 refills | Status: DC
Start: 2020-05-17 — End: 2020-12-03

## 2020-05-17 MED ORDER — HYDROXYZINE HCL 10 MG PO TABS: 10.0000 mg | ORAL_TABLET | Freq: Every day | ORAL | 1 refills | Status: DC | PRN

## 2020-05-17 MED ORDER — ALPRAZOLAM 0.5 MG PO TABS: 0.5000 mg | ORAL_TABLET | Freq: Two times a day (BID) | ORAL | 0 refills | Status: DC | PRN

## 2020-05-17 MED ORDER — COLCHICINE 0.6 MG PO TABS: 0.6000 mg | ORAL_TABLET | Freq: Every day | ORAL | 0 refills | Status: DC | PRN

## 2020-05-31 ENCOUNTER — Other Ambulatory Visit: Payer: Self-pay | Admitting: Family Medicine

## 2020-05-31 DIAGNOSIS — M109 Gout, unspecified: Secondary | ICD-10-CM

## 2020-06-10 ENCOUNTER — Other Ambulatory Visit: Payer: BC Managed Care – PPO

## 2020-06-18 ENCOUNTER — Other Ambulatory Visit: Payer: BC Managed Care – PPO

## 2020-06-20 ENCOUNTER — Other Ambulatory Visit: Payer: BC Managed Care – PPO

## 2020-06-20 DIAGNOSIS — Z20822 Contact with and (suspected) exposure to covid-19: Secondary | ICD-10-CM

## 2020-06-22 LAB — SARS-COV-2, NAA 2 DAY TAT

## 2020-06-22 LAB — NOVEL CORONAVIRUS, NAA: SARS-CoV-2, NAA: NOT DETECTED

## 2020-07-12 ENCOUNTER — Ambulatory Visit: Payer: BC Managed Care – PPO | Admitting: Gastroenterology

## 2020-08-13 DIAGNOSIS — Z13220 Encounter for screening for lipoid disorders: Secondary | ICD-10-CM | POA: Diagnosis not present

## 2020-08-13 DIAGNOSIS — I10 Essential (primary) hypertension: Secondary | ICD-10-CM | POA: Diagnosis not present

## 2020-08-13 DIAGNOSIS — R739 Hyperglycemia, unspecified: Secondary | ICD-10-CM | POA: Diagnosis not present

## 2020-08-13 DIAGNOSIS — M109 Gout, unspecified: Secondary | ICD-10-CM | POA: Diagnosis not present

## 2020-08-14 LAB — CBC WITH DIFFERENTIAL/PLATELET
Absolute Monocytes: 350 cells/uL (ref 200–950)
Basophils Absolute: 40 cells/uL (ref 0–200)
Basophils Relative: 0.6 %
Eosinophils Absolute: 139 cells/uL (ref 15–500)
Eosinophils Relative: 2.1 %
HCT: 46.9 % (ref 38.5–50.0)
Hemoglobin: 15.1 g/dL (ref 13.2–17.1)
Lymphs Abs: 1056 cells/uL (ref 850–3900)
MCH: 25 pg — ABNORMAL LOW (ref 27.0–33.0)
MCHC: 32.2 g/dL (ref 32.0–36.0)
MCV: 77.5 fL — ABNORMAL LOW (ref 80.0–100.0)
MPV: 10.6 fL (ref 7.5–12.5)
Monocytes Relative: 5.3 %
Neutro Abs: 5016 cells/uL (ref 1500–7800)
Neutrophils Relative %: 76 %
Platelets: 277 10*3/uL (ref 140–400)
RBC: 6.05 10*6/uL — ABNORMAL HIGH (ref 4.20–5.80)
RDW: 14.4 % (ref 11.0–15.0)
Total Lymphocyte: 16 %
WBC: 6.6 10*3/uL (ref 3.8–10.8)

## 2020-08-14 LAB — COMPLETE METABOLIC PANEL WITH GFR
AG Ratio: 1.5 (calc) (ref 1.0–2.5)
ALT: 21 U/L (ref 9–46)
AST: 16 U/L (ref 10–35)
Albumin: 4.3 g/dL (ref 3.6–5.1)
Alkaline phosphatase (APISO): 90 U/L (ref 35–144)
BUN: 11 mg/dL (ref 7–25)
CO2: 28 mmol/L (ref 20–32)
Calcium: 9.8 mg/dL (ref 8.6–10.3)
Chloride: 102 mmol/L (ref 98–110)
Creat: 1.13 mg/dL (ref 0.70–1.33)
GFR, Est African American: 87 mL/min/{1.73_m2} (ref >=60)
GFR, Est Non African American: 75 mL/min/{1.73_m2} (ref >=60)
Globulin: 2.9 g/dL (calc) (ref 1.9–3.7)
Glucose, Bld: 131 mg/dL — ABNORMAL HIGH (ref 65–99)
Potassium: 4.1 mmol/L (ref 3.5–5.3)
Sodium: 139 mmol/L (ref 135–146)
Total Bilirubin: 0.8 mg/dL (ref 0.2–1.2)
Total Protein: 7.2 g/dL (ref 6.1–8.1)

## 2020-08-14 LAB — LIPID PANEL
Cholesterol: 152 mg/dL (ref <200)
HDL: 46 mg/dL (ref >=40)
LDL Cholesterol (Calc): 89 mg/dL (calc)
Non-HDL Cholesterol (Calc): 106 mg/dL (calc) (ref <130)
Total CHOL/HDL Ratio: 3.3 (calc) (ref <5.0)
Triglycerides: 78 mg/dL (ref <150)

## 2020-08-14 LAB — URIC ACID: Uric Acid, Serum: 7.5 mg/dL (ref 4.0–8.0)

## 2020-08-14 LAB — HEMOGLOBIN A1C
Hgb A1c MFr Bld: 5.4 % of total Hgb (ref <5.7)
Mean Plasma Glucose: 108 mg/dL
eAG (mmol/L): 6 mmol/L

## 2020-08-19 ENCOUNTER — Encounter: Payer: Self-pay | Admitting: Nurse Practitioner

## 2020-09-07 ENCOUNTER — Telehealth: Payer: Self-pay | Admitting: Nurse Practitioner

## 2020-09-08 NOTE — Telephone Encounter (Signed)
Johnathan Eaton patient's wife called the answering service on Sat 4/9 asking for a bowel prep RX  for a colonoscopy scheduled on Monday 09/09/2020. I explained to his wife that the patient is scheduled for a new patient office visit with you on Monday 4/11 at 8:30. Looks like the appointment is to also discuss reflux symptoms with consideration to schedule an EGD and colonoscopy. She is aware the patient is not scheduled for a procedure on Monday and you would provide orders/prep for procedures.

## 2020-09-09 ENCOUNTER — Other Ambulatory Visit: Payer: Self-pay

## 2020-09-09 ENCOUNTER — Ambulatory Visit (INDEPENDENT_AMBULATORY_CARE_PROVIDER_SITE_OTHER): Payer: BC Managed Care – PPO | Admitting: Nurse Practitioner

## 2020-09-09 ENCOUNTER — Ambulatory Visit: Payer: BC Managed Care – PPO | Admitting: Physician Assistant

## 2020-09-09 ENCOUNTER — Encounter: Payer: Self-pay | Admitting: Nurse Practitioner

## 2020-09-09 VITALS — BP 140/90 | HR 88 | Ht 69.0 in | Wt 219.0 lb

## 2020-09-09 DIAGNOSIS — K219 Gastro-esophageal reflux disease without esophagitis: Secondary | ICD-10-CM | POA: Diagnosis not present

## 2020-09-09 DIAGNOSIS — Z1211 Encounter for screening for malignant neoplasm of colon: Secondary | ICD-10-CM | POA: Diagnosis not present

## 2020-09-09 MED ORDER — NA SULFATE-K SULFATE-MG SULF 17.5-3.13-1.6 GM/177ML PO SOLN: 1.0000 | Freq: Once | ORAL | 0 refills | Status: AC

## 2020-09-09 NOTE — Progress Notes (Signed)
ASSESSMENT AND PLAN     # 50 yo male with longstanding GERD with regurgitation and pyrosis.  Having frequent breakthrough symptoms on daily PPI --Continue Pantoprazole but change timing to 30 minutes PRIOR to breakfast.  --Anti-reflux measures discussed. Certain food / beverages may make GERD symptoms worse such as alcohol, chocolate, fried or high fat foods, peppermint, carbonated beverages, spicy foods, onions, tomatoes, garlic, and tomato-based foods, citrus fruits, and juices.  Avoid late evening meals / bedtime snacking.  If able, elevate head of bed 6-8 inches. If unable to elevate the head of the bed consider a wedge pillow.   Weight reduction / maintaining a healthy BMI (body mass index) is important as increased abdominal girth is associated with reflux.  --Schedule for EGD for evaluation of breakthrough GERD symptoms, epigastric burning and nausea with vomiting. The risks and benefits of EGD were discussed and the patient agrees to proceed.   # Chronic epigastric pain, nausea with vomiting.  Etiology unclear but doubt gastroparesis since symptoms worse in AM on an empty stomach.  --Continue pantoprazole --Further evaluation at time of EGD  # Chronic microcytosis without anemia  HISTORY OF PRESENT ILLNESS     Chief Complaint : discuss having a colonoscopy and an EGD  Johnathan Eaton is a 50 y.o. male new to the practice with a past medical history significant for HTN, obesity, anxiety, and GERD.   Patient has never had a colonoscopy. No bowel changes or blood in stool. No FMH of colon cacner. Patient gives a longstanding history of reflux and heartburn.despite pantoprazaole 40 mg daily. Pizza and certain other foods exacerbate symptoms.   Patient gives a history of PUD 15 years ago. He complains of chronic epigastric burning, nausea and vomiting, especially in the am when stomach is empty.  He describes emesis as "acid" . Eating sometimes helps the symptoms. He sometimes  helps the burning. He takes "chewables" anti-acids 2-3 times a week which helps. Also started taking Pepto a few weeks ago. He takes Ibuprofen but no more than twice a week. No black stools.  Patient does not use marijuana.  He takes no medications or supplements at night which could be contributing to his a.m. symptoms   Data Reviewed: 08/13/20 CMP normal.  Hgb 15.1 MCV 77   Past Medical History:  Diagnosis Date  . Abnormal red blood cells   . Allergy   . Anxiety   . GERD (gastroesophageal reflux disease)   . Hyperglycemia   . Hypertension   . Obesity      Past Surgical History:  Procedure Laterality Date  . SHOULDER SURGERY Left    Rotator Cuff Repair   Family History  Problem Relation Age of Onset  . Hypertension Mother   . Diabetes Mother   . Dementia Mother   . Lung cancer Mother        [name] through treatment  . Pneumonia Mother        covid complications   . Hypertension Father   . Dementia Father   . Hypertension Brother    Social History   Tobacco Use  . Smoking status: Never Smoker  . Smokeless tobacco: Never Used  Vaping Use  . Vaping Use: Never used  Substance Use Topics  . Alcohol use: No    Alcohol/week: 0.0 standard drinks  . Drug use: No   Current Outpatient Medications  Medication Sig Dispense Refill  . ALPRAZolam (XANAX) 0.5 MG tablet Take 1 tablet (0.5 mg total)  by mouth 2 (two) times daily as needed for anxiety. 30 tablet 0  . amLODipine-valsartan (EXFORGE) 5-160 MG tablet Take 1 tablet by mouth daily. 90 tablet 1  . colchicine 0.6 MG tablet TAKE 1 TABLET BY MOUTH DAILY AS NEEDEDED.TAKE 2 AT ONSET OF GOUT AND REPEAT 1 TIME AN HOUR AS NEEDED 30 tablet 0  . hydrOXYzine (ATARAX/VISTARIL) 10 MG tablet Take 1 tablet (10 mg total) by mouth daily as needed. 90 tablet 1  . pantoprazole (PROTONIX) 40 MG tablet Take 1 tablet (40 mg total) by mouth daily before supper. 90 tablet 1   No current facility-administered medications for this visit.   No  Known Allergies   Review of Systems:  All systems reviewed and negative except where noted in HPI.   PHYSICAL EXAM :    Wt Readings from Last 3 Encounters:  05/17/20 220 lb 12.8 oz (100.2 kg)  10/17/18 226 lb (102.5 kg)  04/18/18 231 lb 1.6 oz (104.8 kg)    BP 140/90   Pulse 88   Ht 5\' 9"  (1.753 m)   Wt 219 lb (99.3 kg)   BMI 32.34 kg/m  Constitutional:  Pleasant well-developed male in no acute distress. Psychiatric: Normal mood and affect. Behavior is normal. EENT: Pupils normal.  Conjunctivae are normal. No scleral icterus. Neck supple.  Cardiovascular: Normal rate, regular rhythm. No edema Pulmonary/chest: Effort normal and breath sounds normal. No wheezing, rales or rhonchi. Abdominal: Soft, nondistended, nontender. Bowel sounds active throughout. There are no masses palpable. No hepatomegaly. Neurological: Alert and oriented to person place and time. Skin: Skin is warm and dry. No rashes noted.  , NP  09/09/2020, 8:27 AM

## 2020-09-09 NOTE — Patient Instructions (Addendum)
If you are age 51 or older, your body mass index should be between 23-30. Your Body mass index is 32.34 kg/m. If this is out of the aforementioned range listed, please consider follow up with your Primary Care Provider.  If you are age 8 or younger, your body mass index should be between 19-25. Your Body mass index is 32.34 kg/m. If this is out of the aformentioned range listed, please consider follow up with your Primary Care Provider.   You have been scheduled for an endoscopy and colonoscopy. Please follow the written instructions given to you at your visit today. Please pick up your prep supplies at the pharmacy within the next 1-3 days. If you use inhalers (even only as needed), please bring them with you on the day of your procedure.  Start taking your Pantoprazole 30 minutes before breakfast.  Follow an anti-GERD Regimen.   Gastroesophageal Reflux Disease, Adult  Gastroesophageal reflux (GER) happens when acid from the stomach flows up into the tube that connects the mouth and the stomach (esophagus). Normally, food travels down the esophagus and stays in the stomach to be digested. With GER, food and stomach acid sometimes move back up into the esophagus. You may have a disease called gastroesophageal reflux disease (GERD) if the reflux:  Happens often.  Causes frequent or very bad symptoms.  Causes problems such as damage to the esophagus. When this happens, the esophagus becomes sore and swollen. Over time, GERD can make small holes (ulcers) in the lining of the esophagus. What are the causes? This condition is caused by a problem with the muscle between the esophagus and the stomach. When this muscle is weak or not normal, it does not close properly to keep food and acid from coming back up from the stomach. The muscle can be weak because of:  Tobacco use.  Pregnancy.  Having a certain type of hernia (hiatal hernia).  Alcohol use.  Certain foods and drinks, such as  coffee, chocolate, onions, and peppermint. What increases the risk?  Being overweight.  Having a disease that affects your connective tissue.  Taking NSAIDs, such a ibuprofen. What are the signs or symptoms?  Heartburn.  Difficult or painful swallowing.  The feeling of having a lump in the throat.  A bitter taste in the mouth.  Bad breath.  Having a lot of saliva.  Having an upset or bloated stomach.  Burping.  Chest pain. Different conditions can cause chest pain. Make sure you see your doctor if you have chest pain.  Shortness of breath or wheezing.  A long-term cough or a cough at night.  Wearing away of the surface of teeth (tooth enamel).  Weight loss. How is this treated?  Making changes to your diet.  Taking medicine.  Having surgery. Treatment will depend on how bad your symptoms are. Follow these instructions at home: Eating and drinking  Follow a diet as told by your doctor. You may need to avoid foods and drinks such as: ? Coffee and tea, with or without caffeine. ? Drinks that contain alcohol. ? Energy drinks and sports drinks. ? Bubbly (carbonated) drinks or sodas. ? Chocolate and cocoa. ? Peppermint and mint flavorings. ? Garlic and onions. ? Horseradish. ? Spicy and acidic foods. These include peppers, chili powder, curry powder, vinegar, hot sauces, and BBQ sauce. ? Citrus fruit juices and citrus fruits, such as oranges, lemons, and limes. ? Tomato-based foods. These include red sauce, chili, salsa, and pizza with red sauce. ?  Fried and fatty foods. These include donuts, french fries, potato chips, and high-fat dressings. ? High-fat meats. These include hot dogs, rib eye steak, sausage, ham, and bacon. ? High-fat dairy items, such as whole milk, butter, and cream cheese.  Eat small meals often. Avoid eating large meals.  Avoid drinking large amounts of liquid with your meals.  Avoid eating meals during the 2-3 hours before  bedtime.  Avoid lying down right after you eat.  Do not exercise right after you eat.   Lifestyle  Do not smoke or use any products that contain nicotine or tobacco. If you need help quitting, ask your doctor.  Try to lower your stress. If you need help doing this, ask your doctor.  If you are overweight, lose an amount of weight that is healthy for you. Ask your doctor about a safe weight loss goal.   General instructions  Pay attention to any changes in your symptoms.  Take over-the-counter and prescription medicines only as told by your doctor.  Do not take aspirin, ibuprofen, or other NSAIDs unless your doctor says it is okay.  Wear loose clothes. Do not wear anything tight around your waist.  Raise (elevate) the head of your bed about 6 inches (15 cm). You may need to use a wedge to do this.  Avoid bending over if this makes your symptoms worse.  Keep all follow-up visits. Contact a doctor if:  You have new symptoms.  You lose weight and you do not know why.  You have trouble swallowing or it hurts to swallow.  You have wheezing or a cough that keeps happening.  You have a hoarse voice.  Your symptoms do not get better with treatment. Get help right away if:  You have sudden pain in your arms, neck, jaw, teeth, or back.  You suddenly feel sweaty, dizzy, or light-headed.  You have chest pain or shortness of breath.  You vomit and the vomit is green, yellow, or black, or it looks like blood or coffee grounds.  You faint.  Your poop (stool) is red, bloody, or black.  You cannot swallow, drink, or eat. These symptoms may represent a serious problem that is an emergency. Do not wait to see if the symptoms will go away. Get medical help right away. Call your local emergency services (911 in the [place] Do not drive yourself to the hospital. Summary  If a person has gastroesophageal reflux disease (GERD), food and stomach acid move back up into the esophagus and  cause symptoms or problems such as damage to the esophagus.  Treatment will depend on how bad your symptoms are.  Follow a diet as told by your doctor.  Take all medicines only as told by your doctor. This information is not intended to replace advice given to you by your health care provider. Make sure you discuss any questions you have with your health care provider. Document Revised: 11/27/2019 Document Reviewed: 11/27/2019 Elsevier Patient Education  2021 [employer].

## 2020-09-20 NOTE — Progress Notes (Signed)
Agree with the assessment and plan as outlined by Willette Cluster, NP.  Agree with plan for medical management of reflux with upper endoscopy to evaluate for erosive esophagitis, LES laxity, hiatal hernia, along with Barrett's Esophagus screening.  Scheduled for colonoscopy to be done for routine CRC screening that same day.  Johnathan Govea, DO, [facility]

## 2020-10-10 ENCOUNTER — Encounter: Payer: Self-pay | Admitting: Gastroenterology

## 2020-10-18 ENCOUNTER — Encounter: Payer: BC Managed Care – PPO | Admitting: Gastroenterology

## 2020-10-22 ENCOUNTER — Emergency Department (HOSPITAL_COMMUNITY): Payer: BC Managed Care – PPO

## 2020-10-22 ENCOUNTER — Emergency Department (HOSPITAL_COMMUNITY)
Admission: EM | Admit: 2020-10-22 | Discharge: 2020-10-22 | Disposition: A | Discharge status: Home / Self Care | Payer: BC Managed Care – PPO | Attending: Emergency Medicine | Admitting: Emergency Medicine

## 2020-10-22 DIAGNOSIS — R202 Paresthesia of skin: Secondary | ICD-10-CM | POA: Diagnosis not present

## 2020-10-22 DIAGNOSIS — Z79899 Other long term (current) drug therapy: Secondary | ICD-10-CM | POA: Insufficient documentation

## 2020-10-22 DIAGNOSIS — R079 Chest pain, unspecified: Secondary | ICD-10-CM | POA: Diagnosis not present

## 2020-10-22 DIAGNOSIS — R0789 Other chest pain: Secondary | ICD-10-CM | POA: Diagnosis not present

## 2020-10-22 DIAGNOSIS — R0602 Shortness of breath: Secondary | ICD-10-CM | POA: Diagnosis not present

## 2020-10-22 DIAGNOSIS — I10 Essential (primary) hypertension: Secondary | ICD-10-CM | POA: Insufficient documentation

## 2020-10-22 LAB — TROPONIN I (HIGH SENSITIVITY)
Troponin I (High Sensitivity): 4 ng/L (ref <18)
Troponin I (High Sensitivity): 3 ng/L (ref <18)

## 2020-10-22 LAB — CBC
HCT: 46.8 % (ref 39.0–52.0)
Hemoglobin: 14.8 g/dL (ref 13.0–17.0)
MCH: 24.8 pg — ABNORMAL LOW (ref 26.0–34.0)
MCHC: 31.6 g/dL (ref 30.0–36.0)
MCV: 78.4 fL — ABNORMAL LOW (ref 80.0–100.0)
Platelets: 300 10*3/uL (ref 150–400)
RBC: 5.97 MIL/uL — ABNORMAL HIGH (ref 4.22–5.81)
RDW: 14.5 % (ref 11.5–15.5)
WBC: 6.8 10*3/uL (ref 4.0–10.5)
nRBC: 0 % (ref 0.0–0.2)

## 2020-10-22 LAB — BASIC METABOLIC PANEL
Anion gap: 11 (ref 5–15)
BUN: 11 mg/dL (ref 6–20)
CO2: 26 mmol/L (ref 22–32)
Calcium: 9.2 mg/dL (ref 8.9–10.3)
Chloride: 101 mmol/L (ref 98–111)
Creatinine, Ser: 1.09 mg/dL (ref 0.61–1.24)
GFR, Estimated: >60 mL/min (ref >60)
Glucose, Bld: 108 mg/dL — ABNORMAL HIGH (ref 70–99)
Potassium: 3.8 mmol/L (ref 3.5–5.1)
Sodium: 138 mmol/L (ref 135–145)

## 2020-10-22 NOTE — ED Provider Notes (Signed)
[facility] EMERGENCY DEPARTMENT Provider Note   CSN: [id number] Arrival date & time: 10/22/20  1406     History Chief Complaint  Patient presents with  . Chest Pain    Johnathan Eaton is a 50 y.o. male.  Patient to ED for evaluation of intermittent pain in the left lateral chest associated with SoB and tingling in bilateral hands. Symptoms can last minutes to hours and started several months ago. No nausea, vomiting, cough, fever, recent illness. No pain currently. He reports history of GERD, on QD Protonix, and anxiety on prn Xanax. Wife adds that when he feels the pain in the left chest it is usually associated with epigastric/upper abdominal discomfort.   The history is provided by the patient and the spouse. No language interpreter was used.  Chest Pain Associated symptoms: shortness of breath   Associated symptoms: no diaphoresis, no fever and no nausea        Past Medical History:  Diagnosis Date  . Abnormal red blood cells   . Allergy   . Anxiety   . GERD (gastroesophageal reflux disease)   . Gout   . Hyperglycemia   . Hypertension   . Obesity     Patient Active Problem List   Diagnosis Date Noted  . Gout 04/18/2017  . Allergic rhinitis, seasonal 02/09/2015  . GAD (generalized anxiety disorder) 02/09/2015  . Benign hypertension 02/09/2015  . Blood glucose elevated 02/09/2015  . Gastric reflux 02/09/2015  . Obesity (BMI 30-39.9) 02/09/2015    Past Surgical History:  Procedure Laterality Date  . SHOULDER SURGERY Left    Rotator Cuff Repair       Family History  Problem Relation Age of Onset  . Hypertension Mother   . Dementia Mother   . Lung cancer Mother        [name] through treatment  . Pneumonia Mother        covid complications   . Hypertension Father   . Dementia Father   . Hypertension Brother   . Colon cancer Neg Hx   . Esophageal cancer Neg Hx   . Pancreatic cancer Neg Hx   . Stomach cancer Neg Hx   . Liver  disease Neg Hx     Social History   Tobacco Use  . Smoking status: Never Smoker  . Smokeless tobacco: Never Used  Vaping Use  . Vaping Use: Never used  Substance Use Topics  . Alcohol use: No    Alcohol/week: 0.0 standard drinks  . Drug use: No    Home Medications Prior to Admission medications   Medication Sig Start Date End Date Taking? Authorizing Provider  ALPRAZolam Prudy Feeler) 0.5 MG tablet Take 1 tablet (0.5 mg total) by mouth 2 (two) times daily as needed for anxiety. 05/17/20   Sowles, Danna Hefty, MD  amLODipine-valsartan (EXFORGE) 5-160 MG tablet Take 1 tablet by mouth daily. 05/17/20   Alba Cory, MD  colchicine 0.6 MG tablet TAKE 1 TABLET BY MOUTH DAILY AS NEEDEDED.TAKE 2 AT ONSET OF GOUT AND REPEAT 1 TIME AN HOUR AS NEEDED 05/31/20   Alba Cory, MD  pantoprazole (PROTONIX) 40 MG tablet Take 1 tablet (40 mg total) by mouth daily before supper. 05/17/20   Alba Cory, MD    Allergies    Patient has no known allergies.  Review of Systems   Review of Systems  Constitutional: Negative for chills, diaphoresis and fever.  HENT: Negative.   Respiratory: Positive for shortness of breath.   Cardiovascular: Positive  for chest pain.  Gastrointestinal: Negative.  Negative for nausea.  Musculoskeletal: Negative.   Skin: Negative.   Neurological: Negative.   Psychiatric/Behavioral: The patient is nervous/anxious.     Physical Exam Updated Vital Signs BP (!) 139/92 (BP Location: Right Arm)   Pulse 92   Temp 98.3 F (36.8 C) (Oral)   Resp 18   Ht 5\' 9"  (1.753 m)   Wt 97.5 kg   SpO2 98%   BMI 31.75 kg/m   Physical Exam Vitals and nursing note reviewed.  Constitutional:      Appearance: He is well-developed.  HENT:     Head: Normocephalic.  Neck:     Comments: No carotid bruit  Cardiovascular:     Rate and Rhythm: Normal rate and regular rhythm.     Heart sounds: No murmur heard.   Pulmonary:     Effort: Pulmonary effort is normal.     Breath  sounds: Normal breath sounds. No wheezing, rhonchi or rales.  Abdominal:     General: Bowel sounds are normal.     Palpations: Abdomen is soft.     Tenderness: There is no abdominal tenderness. There is no guarding or rebound.  Musculoskeletal:        General: Normal range of motion.     Cervical back: Normal range of motion and neck supple.     Right lower leg: No edema.     Left lower leg: No edema.  Skin:    General: Skin is warm and dry.     Findings: No rash.  Neurological:     Mental Status: He is alert and oriented to person, place, and time.     ED Results / Procedures / Treatments   Labs (all labs ordered are listed, but only abnormal results are displayed) Labs Reviewed  BASIC METABOLIC PANEL - Abnormal; Notable for the following components:      Result Value   Glucose, Bld 108 (*)    All other components within normal limits  CBC - Abnormal; Notable for the following components:   RBC 5.97 (*)    MCV 78.4 (*)    MCH 24.8 (*)    All other components within normal limits  TROPONIN I (HIGH SENSITIVITY)  TROPONIN I (HIGH SENSITIVITY)    EKG EKG Interpretation  Date/Time:  Tuesday Oct 22 2020 14:13:07 EDT Ventricular Rate:  98 PR Interval:  132 QRS Duration: 92 QT Interval:  336 QTC Calculation: 428 R Axis:   50 Text Interpretation: Normal sinus rhythm with sinus arrhythmia Normal ECG when compared to prior, faster rate. No STEMI Confirmed by [id number] (Theda Belfast) on 10/22/2020 10:14:49 PM   Radiology DG Chest 2 View  Result Date: 10/22/2020 CLINICAL DATA:  50 year old male with history of left-sided chest pain EXAM: CHEST - 2 VIEW COMPARISON:  Chest x-ray 12/12/2006. FINDINGS: Lung volumes are normal. No consolidative airspace disease. No pleural effusions. No pneumothorax. No pulmonary nodule or mass noted. Pulmonary vasculature and the cardiomediastinal silhouette are within normal limits. Orthopedic fixation screws in the left scapula. IMPRESSION: No  radiographic evidence of acute cardiopulmonary disease. Electronically Signed   By: 12/14/2006 M.D.   On: 10/22/2020 15:14    Procedures Procedures   Medications Ordered in ED Medications - No data to display  ED Course  I have reviewed the triage vital signs and the nursing notes.  Pertinent labs & imaging results that were available during my care of the patient were reviewed by me and considered in  my medical decision making (see chart for details).    MDM Rules/Calculators/A&P                          Patient to ED with ss/sxs as per HPI.   The patient is concerned for months of left chest pain and SOB. His cardiac work up today is negative. He has no current pain. Doubt ACS, dissection, acute infection.   DDX: GERD vs anxiety  He reports he is increasingly afraid to leave his house because he might have symptoms. He does not ride his motorcycle anymore. He goes to the grocery store and only feels that he doesn't want to be there. History strongly suggests anxiety. When he takes his Xanax it helps symptoms. Suggested counseling may be of significant benefit.  He is also reporting upper abdominal pain when the left chest pain is present suggesting component of reflux. Suggested he increase Protonix to twice daily for the next week and reporting the results to his primary care physician in follow up.   Final Clinical Impression(s) / ED Diagnoses Final diagnoses:  None   1. Nonspecific chest pain  Rx / DC Orders ED Discharge Orders    None       Elpidio Anis, Cordelia Poche 10/22/20 2309    Tegeler, Canary Brim, MD 10/23/20 [phone]

## 2020-10-22 NOTE — Discharge Instructions (Addendum)
Follow up with your doctor as planned for recheck. Take your medications as prescribed, including as needed Xanax. You may want to increase your Protonix to twice daily for the next week to see if this helps decrease the left sided chest pain.   Return to the ED with any new or concerning symptoms.

## 2020-10-22 NOTE — ED Triage Notes (Signed)
Pt reports left sided CP with tingling in hands for the last couple of weeks. Denies SOB, cough, fevers. Pt reports some reflux and anxiety hx. No prior cardiac hx. VSS.

## 2020-11-22 NOTE — Progress Notes (Signed)
Name: Johnathan Eaton   MRN: [id number]    DOB: 08-22-1970   Date:11/25/2020       Progress Note  Subjective  Chief Complaint  Annual Exam & Follow Up  HPI  Patient presents for annual CPE and follow up. ( They are aware of possible extra cost)   HTN: he was  taking  Valsartan/HCTZ, but  we changed to Exforge since he has a history of gout - eliminated HCTZ. BP is slightly elevated today but improved with rest, repeat level was 136/86 - continue current regiment    GERD: seen by Gastroenterologist 09/09/2020 with worsening of symptoms , taking PPI twice daily now and will have EGD and colonoscopy on 12/04/2020 He states still having burning sensation on sub-sternal area   Substernal chest pain: he has noticed left side chest pain described as sharp feeling , more in his axilla area, occasionally on right side, associated with tingling of left arm and sometimes his legs . He states at times his legs feels weak. He is still worrying a lot about his father , wife states it seems to be related to his anxiety . He states he is not sure if related to his stomach. He went to [facility] and troponin negative. He also has noticed increase in GERD symptoms    GAD/Depression: he states he resumed  Citalopram when his mother died 07/29/2019, but it  decreases his libido and makes him sleepy during the day therefore he  is off medication again .  He is still taking hydroxyzine and alprazolam prn and needs refills. He is feeling overwhelmed, worries about his father also lack of motivation, not going to work. Discussed starting him on Wellbutrin .    Controlled gout: uric acid level improve when he stopped HCTZ, he still has flares about every 9 months , no recent episodes, taking prn medication only    Hyperglycemia: he denies polyphagia, polydipsia or polyuria, last hgbA1C was normal at 5.4 %  Discussed statin therapy, they would like to try increase in physical activity , tree nuts and fish weekly   The  10-year ASCVD risk score Denman George DC Montez Hageman., et al., 2013) is: 9.7%   Values used to calculate the score:     Age: 50 years     Sex: Male     Is Non-Hispanic African American: Yes     Diabetic: No     Tobacco smoker: No     Systolic Blood Pressure: 136 mmHg     Is BP treated: Yes     HDL Cholesterol: 46 mg/dL     Total Cholesterol: 152 mg/dL     IPSS Questionnaire (AUA-7): Over the past month.   1)  How often have you had a sensation of not emptying your bladder completely after you finish urinating?  0 - Not at all  2)  How often have you had to urinate again less than two hours after you finished urinating? 0 - Not at all  3)  How often have you found you stopped and started again several times when you urinated?  0 - Not at all  4) How difficult have you found it to postpone urination?  0 - Not at all  5) How often have you had a weak urinary stream?  0 - Not at all  6) How often have you had to push or strain to begin urination?  0 - Not at all  7) How many times did you most  typically get up to urinate from the time you went to bed until the time you got up in the morning?  1 - 1 time  Total score:  0-7 mildly symptomatic   8-19 moderately symptomatic   20-35 severely symptomatic     Diet: discussed balanced diet  Exercise: discussed increase in physical activity   Depression: phq 9 is negative [facility] screen [facility] 11/25/2020 05/17/2020 03/05/2020 10/18/2019 01/10/2019  Decreased Interest 2 0 0 0 0  Down, Depressed, Hopeless 1 0 0 0 1  PHQ - 2 Score 3 0 0 0 1  Altered sleeping 0 1 - 0 0  Tired, decreased energy 1 1 - 0 1  Change in appetite 0 1 - 0 0  Feeling bad or failure about yourself  0 1 - 0 0  Trouble concentrating 0 1 - 0 0  Moving slowly or fidgety/restless 0 0 - 0 0  Suicidal thoughts 0 0 - 0 0  PHQ-9 Score 4 5 - 0 2  Difficult doing work/chores Not difficult at all Somewhat difficult - Not difficult at all Not difficult at all   Positive   GAD 7 :  Generalized Anxiety Score 11/25/2020 05/17/2020 01/10/2019 10/17/2018  Nervous, Anxious, on Edge 1 2 1 1   Control/stop worrying 1 1 1 1   Worry too much - different things 2 1 1 1   Trouble relaxing 0 1 0 0  Restless 1 1 1  0  Easily annoyed or irritable 1 0 1 1  Afraid - awful might happen 1 0 3 0  Total GAD 7 Score 7 6 8 4   Anxiety Difficulty Somewhat difficult Somewhat difficult Somewhat difficult Somewhat difficult     Hypertension:  BP Readings from Last 3 Encounters:  11/25/20 136/86  10/22/20 128/90  09/09/20 140/90    Obesity: Wt Readings from Last 3 Encounters:  11/25/20 213 lb (96.6 kg)  10/22/20 215 lb (97.5 kg)  09/09/20 219 lb (99.3 kg)   BMI Readings from Last 3 Encounters:  11/25/20 31.45 kg/m  10/22/20 31.75 kg/m  09/09/20 32.34 kg/m     Lipids:  Lab Results  Component Value Date   CHOL 152 08/13/2020   CHOL 147 06/15/2019   CHOL 138 04/16/2017   Lab Results  Component Value Date   HDL 46 08/13/2020   HDL 45 06/15/2019   HDL 46 04/16/2017   Lab Results  Component Value Date   LDLCALC 89 08/13/2020   LDLCALC 87 06/15/2019   LDLCALC 71 04/16/2017   Lab Results  Component Value Date   TRIG 78 08/13/2020   TRIG 60 06/15/2019   TRIG 125 04/16/2017   Lab Results  Component Value Date   CHOLHDL 3.3 08/13/2020   CHOLHDL 3.3 06/15/2019   CHOLHDL 3.0 04/16/2017   No results found for: LDLDIRECT Glucose:  Glucose, Bld  Date Value Ref Range Status  10/22/2020 108 (H) 70 - 99 mg/dL Final    Comment:    Glucose reference range applies only to samples taken after fasting for at least 8 hours.  08/13/2020 131 (H) 65 - 99 mg/dL Final    Comment:    .            Fasting reference interval . For someone without known diabetes, a glucose value >125 mg/dL indicates that they may have diabetes and this should be confirmed with a follow-up test. .   06/15/2019 108 (H) 65 - 99 mg/dL Final    Comment:    .  Fasting reference  interval . For someone without known diabetes, a glucose value between 100 and 125 mg/dL is consistent with prediabetes and should be confirmed with a follow-up test. .     Flowsheet Row Office Visit from 05/17/2020 in [facility]  AUDIT-C Score 0       Married STD testing and prevention (HIV/chl/gon/syphilis): not interested  Hep C: 06/15/19  Skin cancer: Discussed monitoring for atypical lesions Colorectal cancer: Ordered today Prostate cancer:  Lab Results  Component Value Date   PSA Normal, 0.4 11/29/2009     Lung cancer:  Low Dose CT Chest recommended if Age 23-80 years, 30 pack-year currently smoking OR have quit w/in 15years. Patient does not qualify.   AAA: The USPSTF recommends one-time screening with ultrasonography in men ages 18 to 45 years who have ever smoked ECG:  10/23/20  Vaccines:   Shingrix: refused today  Pneumonia: educated and discussed with patient. Flu: educated and discussed with patient.  Advanced Care Planning: A voluntary discussion about advance care planning including the explanation and discussion of advance directives.  Discussed health care proxy and Living will, and the patient was able to identify a health care proxy as wife   Patient Active Problem List   Diagnosis Date Noted   Gout 04/18/2017   Allergic rhinitis, seasonal 02/09/2015   GAD (generalized anxiety disorder) 02/09/2015   Benign hypertension 02/09/2015   Blood glucose elevated 02/09/2015   Gastric reflux 02/09/2015   Obesity (BMI 30-39.9) 02/09/2015    Past Surgical History:  Procedure Laterality Date   SHOULDER SURGERY Left    Rotator Cuff Repair    Family History  Problem Relation Age of Onset   Hypertension Mother    Dementia Mother    Lung cancer Mother        [name] through treatment   Pneumonia Mother        covid complications    Hypertension Father    Dementia Father    Hypertension Brother    Colon cancer Neg Hx    Esophageal  cancer Neg Hx    Pancreatic cancer Neg Hx    Stomach cancer Neg Hx    Liver disease Neg Hx     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   Tobacco Use   Smoking status: Never   Smokeless tobacco: Never  Vaping Use   Vaping Use: Never used  Substance and Sexual Activity   Alcohol use: No    Alcohol/week: 0.0 standard drinks   Drug use: No   Sexual activity: Yes    Partners: Female  Other Topics Concern   Not on file  Social History Narrative   Not on file   Social Determinants of Health   Financial Resource Strain: Low Risk    Difficulty of Paying Living Expenses: Not hard at all  Food Insecurity: No Food Insecurity   Worried About Programme researcher, broadcasting/film/video in the Last Year: Never true   Barista in the Last Year: Never true  Transportation Needs: No Transportation Needs   Lack of Transportation (Medical): No   Lack of Transportation (Non-Medical): No  Physical Activity: Sufficiently Active   Days of Exercise per Week: 5 days   Minutes of Exercise per  Session: 30 min  Stress: Stress Concern Present   Feeling of Stress : Rather much  Social Connections: Socially Integrated   Frequency of Communication with Friends and Family: More than three times a week   Frequency of Social Gatherings with Friends and Family: Three times a week   Attends Religious Services: More than 4 times per year   Active Member of Clubs or Organizations: Yes   Attends Engineer, structural: More than 4 times per year   Marital Status: Married  Catering manager Violence: Not At Risk   Fear of Current or Ex-Partner: No   Emotionally Abused: No   Physically Abused: No   Sexually Abused: No     Current Outpatient Medications:    buPROPion (WELLBUTRIN XL) 150 MG 24 hr tablet, Take 1  tablet (150 mg total) by mouth daily., Disp: 90 tablet, Rfl: 0   colchicine 0.6 MG tablet, TAKE 1 TABLET BY MOUTH DAILY AS NEEDEDED.TAKE 2 AT ONSET OF GOUT AND REPEAT 1 TIME AN HOUR AS NEEDED, Disp: 30 tablet, Rfl: 0   pantoprazole (PROTONIX) 40 MG tablet, Take 1 tablet (40 mg total) by mouth daily before supper., Disp: 90 tablet, Rfl: 1   ALPRAZolam (XANAX) 0.5 MG tablet, Take 1 tablet (0.5 mg total) by mouth 2 (two) times daily as needed for anxiety., Disp: 30 tablet, Rfl: 0   amLODipine-valsartan (EXFORGE) 5-160 MG tablet, Take 1 tablet by mouth daily., Disp: 90 tablet, Rfl: 1  No Known Allergies   ROS  Constitutional: Negative for fever , positive for weight change.  Respiratory: positive for cough intermittently but no  shortness of breath.   Cardiovascular: positive for chest pain but no palpitations.  Gastrointestinal: Negative for abdominal pain, no bowel changes.  Musculoskeletal: Negative for gait problem or joint swelling.  Skin: Negative for rash.  Neurological: Negative for dizziness or headache.  No other specific complaints in a complete review of systems (except as listed in HPI above).   Objective  . Vitals:   11/25/20 0948 11/25/20 1046  BP: 140/88 136/86  Pulse: 77   Resp: 16   Temp: 97.8 F (36.6 C)   SpO2: 98%   Weight: 213 lb (96.6 kg)   Height: 5\' 9"  (1.753 m)     Body mass index is 31.45 kg/m.  Physical Exam  Constitutional: Patient appears well-developed and well-nourished. No distress.  HENT: Head: Normocephalic and atraumatic. Ears: B TMs ok, no erythema or effusion; Nose: Nose normal. Mouth/Throat: not done  Eyes: Conjunctivae and EOM are normal. Pupils are equal, round, and reactive to light. No scleral icterus.  Neck: Normal range of motion. Neck supple. No JVD present. No thyromegaly present.  Cardiovascular: Normal rate, regular rhythm and normal heart sounds.  No murmur heard. No BLE edema. Pulmonary/Chest: Effort normal and breath sounds  normal. No respiratory distress. Abdominal: Soft. Bowel sounds are normal, no distension. There is no tenderness. no masses MALE GENITALIA: Normal descended testes bilaterally, no masses palpated, no hernias, no lesions, no discharge RECTAL: not done  Musculoskeletal: Normal range of motion, no joint effusions. No gross deformities Neurological: he is alert and oriented to person, place, and time. No cranial nerve deficit. Coordination, balance, strength, speech and gait are normal.  Skin: Skin is warm and dry. No rash noted. No erythema.  Psychiatric: Patient has a normal mood and affect. behavior is normal. Judgment and thought content normal.   Recent Results (from the past 2160 hour(s))  Basic metabolic panel  Status: Abnormal   Collection Time: 10/22/20  2:18 PM  Result Value Ref Range   Sodium 138 135 - 145 mmol/L   Potassium 3.8 3.5 - 5.1 mmol/L   Chloride 101 98 - 111 mmol/L   CO2 26 22 - 32 mmol/L   Glucose, Bld 108 (H) 70 - 99 mg/dL    Comment: Glucose reference range applies only to samples taken after fasting for at least 8 hours.   BUN 11 6 - 20 mg/dL   Creatinine, Ser 8.82 0.61 - 1.24 mg/dL   Calcium 9.2 8.9 - 80.0 mg/dL   GFR, Estimated >34 >91 mL/min    Comment: (NOTE) Calculated using the CKD-EPI Creatinine Equation (2021)    Anion gap 11 5 - 15    Comment: Performed at [facility], [address] [place], [place] [address]  CBC     Status: Abnormal   Collection Time: 10/22/20  2:18 PM  Result Value Ref Range   WBC 6.8 4.0 - 10.5 K/uL   RBC 5.97 (H) 4.22 - 5.81 MIL/uL   Hemoglobin 14.8 13.0 - 17.0 g/dL   HCT 56.9 79.4 - 80.1 %   MCV 78.4 (L) 80.0 - 100.0 fL   MCH 24.8 (L) 26.0 - 34.0 pg   MCHC 31.6 30.0 - 36.0 g/dL   RDW 65.5 37.4 - 82.7 %   Platelets 300 150 - 400 K/uL   nRBC 0.0 0.0 - 0.2 %    Comment: Performed at [facility], [address] [place], [place] [address]  Troponin I (High Sensitivity)     Status: None   Collection Time:  10/22/20  2:18 PM  Result Value Ref Range   Troponin I (High Sensitivity) 4 <18 ng/L    Comment: (NOTE) Elevated high sensitivity troponin I (hsTnI) values and significant  changes across serial measurements may suggest ACS but many other  chronic and acute conditions are known to elevate hsTnI results.  Refer to the "Links" section for chest pain algorithms and additional  guidance. Performed at [facility], [address], [place], [place] [address]   Troponin I (High Sensitivity)     Status: None   Collection Time: 10/22/20  9:09 PM  Result Value Ref Range   Troponin I (High Sensitivity) 3 <18 ng/L    Comment: (NOTE) Elevated high sensitivity troponin I (hsTnI) values and significant  changes across serial measurements may suggest ACS but many other  chronic and acute conditions are known to elevate hsTnI results.  Refer to the "Links" section for chest pain algorithms and additional  guidance. Performed at [facility], [address] [place], [place] [address]      Fall Risk: Fall Risk  11/25/2020 05/17/2020 03/05/2020 10/18/2019 10/17/2018  Falls in the past year? 0 1 0 0 0  Number falls in past yr: 0 1 0 0 0  Injury with Fall? 0 0 0 0 0  Risk for fall due to : - History of fall(s) - - -  Follow up - - Falls evaluation completed Falls evaluation completed -     Functional Status Survey: Is the patient deaf or have difficulty hearing?: No Does the patient have difficulty seeing, even when wearing glasses/contacts?: No Does the patient have difficulty concentrating, remembering, or making decisions?: No Does the patient have difficulty walking or climbing stairs?: No Does the patient have difficulty dressing or bathing?: No Does the patient have difficulty doing errands alone such as  visiting a doctor's office or shopping?: No    Assessment & Plan  1. Well adult exam   2. Colon cancer screening  Scheduled for July 6th   3. GAD (generalized  anxiety disorder)  - ALPRAZolam (XANAX) 0.5 MG tablet; Take 1 tablet (0.5 mg total) by mouth 2 (two) times daily as needed for anxiety.  Dispense: 30 tablet; Refill: 0 - buPROPion (WELLBUTRIN XL) 150 MG 24 hr tablet; Take 1 tablet (150 mg total) by mouth daily.  Dispense: 90 tablet; Refill: 0  4. Benign hypertension  - amLODipine-valsartan (EXFORGE) 5-160 MG tablet; Take 1 tablet by mouth daily.  Dispense: 90 tablet; Refill: 1  5. Controlled gout   6. GERD without esophagitis   7. Intermittent left-sided chest pain   8. Dyslipidemia   9. Dysthymia  He does not SSRI because of sexual side effects and lack of libido, we will try wellbutrin  - buPROPion (WELLBUTRIN XL) 150 MG 24 hr tablet; Take 1 tablet (150 mg total) by mouth daily.  Dispense: 90 tablet; Refill: 0    -Prostate cancer screening and PSA options (with potential risks and benefits of testing vs not testing) were discussed along with recent recs/guidelines. -USPSTF grade A and B recommendations reviewed with patient; age-appropriate recommendations, preventive care, screening tests, etc discussed and encouraged; healthy living encouraged; see AVS for patient education given to patient -Discussed importance of 150 minutes of physical activity weekly, eat two servings of fish weekly, eat one serving of tree nuts ( cashews, pistachios, pecans, almonds.Marland Kitchen) every other day, eat 6 servings of fruit/vegetables daily and drink plenty of water and avoid sweet beverages.

## 2020-11-22 NOTE — Patient Instructions (Signed)
Preventive Care 51-51 Years Old, Male Preventive care refers to lifestyle choices and visits with your health care provider that can promote health and wellness. This includes: A yearly physical exam. This is also called an annual wellness visit. Regular dental and eye exams. Immunizations. Screening for certain conditions. Healthy lifestyle choices, such as: Eating a healthy diet. Getting regular exercise. Not using drugs or products that contain nicotine and tobacco. Limiting alcohol use. What can I expect for my preventive care visit? Physical exam Your health care provider will check your: Height and weight. These may be used to calculate your BMI (body mass index). BMI is a measurement that tells if you are at a healthy weight. Heart rate and blood pressure. Body temperature. Skin for abnormal spots. Counseling Your health care provider may ask you questions about your: Past medical problems. Family's medical history. Alcohol, tobacco, and drug use. Emotional well-being. Home life and relationship well-being. Sexual activity. Diet, exercise, and sleep habits. Work and work environment. Access to firearms. What immunizations do I need?  Vaccines are usually given at various ages, according to a schedule. Your health care provider will recommend vaccines for you based on your age, medicalhistory, and lifestyle or other factors, such as travel or where you work. What tests do I need? Blood tests Lipid and cholesterol levels. These may be checked every 5 years, or more often if you are over 50 years old. Hepatitis C test. Hepatitis B test. Screening Lung cancer screening. You may have this screening every year starting at age 55 if you have a 30-pack-year history of smoking and currently smoke or have quit within the past 15 years. Prostate cancer screening. Recommendations will vary depending on your family history and other risks. Genital exam to check for testicular cancer  or hernias. Colorectal cancer screening. All adults should have this screening starting at age 50 and continuing until age 75. Your health care provider may recommend screening at age 45 if you are at increased risk. You will have tests every 1-10 years, depending on your results and the type of screening test. Diabetes screening. This is done by checking your blood sugar (glucose) after you have not eaten for a while (fasting). You may have this done every 1-3 years. STD (sexually transmitted disease) testing, if you are at risk. Follow these instructions at home: Eating and drinking  Eat a diet that includes fresh fruits and vegetables, whole grains, lean protein, and low-fat dairy products. Take vitamin and mineral supplements as recommended by your health care provider. Do not drink alcohol if your health care provider tells you not to drink. If you drink alcohol: Limit how much you have to 0-2 drinks a day. Be aware of how much alcohol is in your drink. In the [place] one drink equals one 12 oz bottle of beer (355 mL), one 5 oz glass of wine (148 mL), or one 1 oz glass of hard liquor (44 mL).  Lifestyle Take daily care of your teeth and gums. Brush your teeth every morning and night with fluoride toothpaste. Floss one time each day. Stay active. Exercise for at least 30 minutes 5 or more days each week. Do not use any products that contain nicotine or tobacco, such as cigarettes, e-cigarettes, and chewing tobacco. If you need help quitting, ask your health care provider. Do not use drugs. If you are sexually active, practice safe sex. Use a condom or other form of protection to prevent STIs (sexually transmitted infections). If told by   your health care provider, take low-dose aspirin daily starting at age 50. Find healthy ways to cope with stress, such as: Meditation, yoga, or listening to music. Journaling. Talking to a trusted person. Spending time with friends and  family. Safety Always wear your seat belt while driving or riding in a vehicle. Do not drive: If you have been drinking alcohol. Do not ride with someone who has been drinking. When you are tired or distracted. While texting. Wear a helmet and other protective equipment during sports activities. If you have firearms in your house, make sure you follow all gun safety procedures. What's next? Go to your health care provider once a year for an annual wellness visit. Ask your health care provider how often you should have your eyes and teeth checked. Stay up to date on all vaccines. This information is not intended to replace advice given to you by your health care provider. Make sure you discuss any questions you have with your healthcare provider. Document Revised: 02/14/2019 Document Reviewed: 05/12/2018 Elsevier Patient Education  2022 [employer]  

## 2020-11-25 ENCOUNTER — Ambulatory Visit (INDEPENDENT_AMBULATORY_CARE_PROVIDER_SITE_OTHER): Payer: BC Managed Care – PPO | Admitting: Family Medicine

## 2020-11-25 ENCOUNTER — Encounter: Payer: Self-pay | Admitting: Family Medicine

## 2020-11-25 ENCOUNTER — Other Ambulatory Visit: Payer: Self-pay

## 2020-11-25 VITALS — BP 136/86 | HR 77 | Temp 97.8°F | Resp 16 | Ht 69.0 in | Wt 213.0 lb

## 2020-11-25 DIAGNOSIS — R0789 Other chest pain: Secondary | ICD-10-CM

## 2020-11-25 DIAGNOSIS — Z1211 Encounter for screening for malignant neoplasm of colon: Secondary | ICD-10-CM | POA: Diagnosis not present

## 2020-11-25 DIAGNOSIS — K219 Gastro-esophageal reflux disease without esophagitis: Secondary | ICD-10-CM

## 2020-11-25 DIAGNOSIS — E785 Hyperlipidemia, unspecified: Secondary | ICD-10-CM

## 2020-11-25 DIAGNOSIS — M109 Gout, unspecified: Secondary | ICD-10-CM

## 2020-11-25 DIAGNOSIS — F341 Dysthymic disorder: Secondary | ICD-10-CM

## 2020-11-25 DIAGNOSIS — Z0000 Encounter for general adult medical examination without abnormal findings: Secondary | ICD-10-CM | POA: Diagnosis not present

## 2020-11-25 DIAGNOSIS — F411 Generalized anxiety disorder: Secondary | ICD-10-CM | POA: Diagnosis not present

## 2020-11-25 DIAGNOSIS — I10 Essential (primary) hypertension: Secondary | ICD-10-CM

## 2020-11-25 MED ORDER — ALPRAZOLAM 0.5 MG PO TABS: 0.5000 mg | ORAL_TABLET | Freq: Two times a day (BID) | ORAL | 0 refills | Status: DC | PRN

## 2020-11-25 MED ORDER — AMLODIPINE BESYLATE-VALSARTAN 5-160 MG PO TABS: 1.0000 | ORAL_TABLET | Freq: Every day | ORAL | 1 refills | Status: DC

## 2020-11-25 MED ORDER — BUPROPION HCL ER (XL) 150 MG PO TB24: 150.0000 mg | ORAL_TABLET | Freq: Every day | ORAL | 0 refills | Status: DC

## 2020-12-02 ENCOUNTER — Other Ambulatory Visit: Payer: Self-pay | Admitting: Family Medicine

## 2020-12-02 DIAGNOSIS — K219 Gastro-esophageal reflux disease without esophagitis: Secondary | ICD-10-CM

## 2020-12-04 ENCOUNTER — Other Ambulatory Visit: Payer: Self-pay

## 2020-12-04 ENCOUNTER — Ambulatory Visit (AMBULATORY_SURGERY_CENTER): Payer: BC Managed Care – PPO | Admitting: Gastroenterology

## 2020-12-04 ENCOUNTER — Encounter: Payer: Self-pay | Admitting: Gastroenterology

## 2020-12-04 VITALS — BP 124/84 | HR 80 | Temp 96.9°F | Resp 24 | Ht 69.0 in | Wt 219.0 lb

## 2020-12-04 DIAGNOSIS — K219 Gastro-esophageal reflux disease without esophagitis: Secondary | ICD-10-CM | POA: Diagnosis not present

## 2020-12-04 DIAGNOSIS — G8929 Other chronic pain: Secondary | ICD-10-CM

## 2020-12-04 DIAGNOSIS — R112 Nausea with vomiting, unspecified: Secondary | ICD-10-CM

## 2020-12-04 DIAGNOSIS — K5730 Diverticulosis of large intestine without perforation or abscess without bleeding: Secondary | ICD-10-CM

## 2020-12-04 DIAGNOSIS — Z1211 Encounter for screening for malignant neoplasm of colon: Secondary | ICD-10-CM | POA: Diagnosis not present

## 2020-12-04 DIAGNOSIS — R1013 Epigastric pain: Secondary | ICD-10-CM | POA: Diagnosis not present

## 2020-12-04 DIAGNOSIS — R12 Heartburn: Secondary | ICD-10-CM | POA: Diagnosis not present

## 2020-12-04 HISTORY — PX: UPPER GASTROINTESTINAL ENDOSCOPY: SHX188

## 2020-12-04 HISTORY — PX: COLONOSCOPY: SHX174

## 2020-12-04 MED ORDER — SODIUM CHLORIDE 0.9 % IV SOLN: 500.0000 mL | Freq: Once | INTRAVENOUS | Status: DC

## 2020-12-04 NOTE — Op Note (Signed)
[facility] Patient Name: Johnathan Eaton Procedure Date: 12/04/2020 1:04 PM MRN: [id number] Endoscopist: Doristine Locks , MD Age: 50 Referring MD:  Date of Birth: [birth year] Gender: Male Account #: [id number] Procedure:                Colonoscopy Indications:              Screening for colorectal malignant neoplasm, This                            is the patient's first colonoscopy Medicines:                Monitored Anesthesia Care Procedure:                Pre-Anesthesia Assessment:                           - Prior to the procedure, a History and Physical                            was performed, and patient medications and                            allergies were reviewed. The patient's tolerance of                            previous anesthesia was also reviewed. The risks                            and benefits of the procedure and the sedation                            options and risks were discussed with the patient.                            All questions were answered, and informed consent                            was obtained. Prior Anticoagulants: The patient has                            taken no previous anticoagulant or antiplatelet                            agents. ASA Grade Assessment: II - A patient with                            mild systemic disease. After reviewing the risks                            and benefits, the patient was deemed in                            satisfactory condition to undergo the procedure.  After obtaining informed consent, the colonoscope                            was passed under direct vision. Throughout the                            procedure, the patient's blood pressure, pulse, and                            oxygen saturations were monitored continuously. The                            CF [id number] #[id number] was introduced through the anus                            and advanced to the the  terminal ileum. The                            colonoscopy was performed without difficulty. The                            patient tolerated the procedure well. The quality                            of the bowel preparation was good. The terminal                            ileum, ileocecal valve, appendiceal orifice, and                            rectum were photographed. Scope In: 1:19:53 PM Scope Out: 1:31:49 PM Scope Withdrawal Time: 0 hours 10 minutes 13 seconds  Total Procedure Duration: 0 hours 11 minutes 56 seconds  Findings:                 The perianal and digital rectal examinations were                            normal.                           A few small-mouthed diverticula were found in the                            sigmoid colon.                           The exam was otherwise normal throughout the                            remainder of the colon.                           The retroflexed view of the distal rectum and anal  verge was normal and showed no anal or rectal                            abnormalities.                           The terminal ileum appeared normal. Complications:            No immediate complications. Estimated Blood Loss:     Estimated blood loss: none. Impression:               - Diverticulosis in the sigmoid colon.                           - The distal rectum and anal verge are normal on                            retroflexion view.                           - The examined portion of the ileum was normal.                           - No specimens collected. Recommendation:           - Patient has a contact number available for                            emergencies. The signs and symptoms of potential                            delayed complications were discussed with the                            patient. Return to normal activities tomorrow.                            Written discharge instructions were  provided to the                            patient.                           - Resume previous diet.                           - Continue present medications.                           - Repeat colonoscopy in 10 years for screening                            purposes.                           - Return to GI office PRN. Doristine Locks, MD 12/04/2020 1:40:26 PM

## 2020-12-04 NOTE — Op Note (Signed)
[facility] Patient Name: Johnathan Eaton Procedure Date: 12/04/2020 1:04 PM MRN: [id number] Endoscopist: Doristine Locks , MD Age: 50 Referring MD:  Date of Birth: 1970-06-20 Gender: Male Account #: [id number] Procedure:                Upper GI endoscopy Indications:              Epigastric abdominal pain, Heartburn, Suspected                            esophageal reflux, Nausea with vomiting Medicines:                Monitored Anesthesia Care Procedure:                Pre-Anesthesia Assessment:                           - Prior to the procedure, a History and Physical                            was performed, and patient medications and                            allergies were reviewed. The patient's tolerance of                            previous anesthesia was also reviewed. The risks                            and benefits of the procedure and the sedation                            options and risks were discussed with the patient.                            All questions were answered, and informed consent                            was obtained. Prior Anticoagulants: The patient has                            taken no previous anticoagulant or antiplatelet                            agents. ASA Grade Assessment: II - A patient with                            mild systemic disease. After reviewing the risks                            and benefits, the patient was deemed in                            satisfactory condition to undergo the procedure.  After obtaining informed consent, the endoscope was                            passed under direct vision. Throughout the                            procedure, the patient's blood pressure, pulse, and                            oxygen saturations were monitored continuously. The                            Endoscope was introduced through the mouth, and                            advanced to the  second part of duodenum. The upper                            GI endoscopy was accomplished without difficulty.                            The patient tolerated the procedure well. Scope In: Scope Out: Findings:                 The examined esophagus was normal.                           The Z-line was regular and was found 41 cm from the                            incisors.                           The gastroesophageal flap valve was visualized                            endoscopically and classified as Hill Grade I                            (prominent fold, tight to endoscope).                           The entire examined stomach was normal. Biopsies                            were taken with a cold forceps for Helicobacter                            pylori testing. Estimated blood loss was minimal.                           The examined duodenum was normal. Complications:            No immediate complications. Estimated Blood Loss:     Estimated blood loss was minimal. Impression:               -  Normal esophagus.                           - Z-line regular, 41 cm from the incisors.                           - Gastroesophageal flap valve classified as Hill                            Grade I (prominent fold, tight to endoscope).                           - Normal stomach. Biopsied.                           - Normal examined duodenum. Recommendation:           - Patient has a contact number available for                            emergencies. The signs and symptoms of potential                            delayed complications were discussed with the                            patient. Return to normal activities tomorrow.                            Written discharge instructions were provided to the                            patient.                           - Resume previous diet.                           - Continue present medications.                           - Await  pathology results.                           - Return to GI clinic PRN.                           - If biopsies unrevealing and reflux symptoms                            persist, can consider further evaluation with                            pH/Impedance testing. If nausea/vomiting persist,                            can consider GES.                           -  Colonoscopy today. Doristine LocksVito Shilah Hefel, MD 12/04/2020 1:36:53 PM

## 2020-12-04 NOTE — Progress Notes (Signed)
PT taken to PACU. Monitors in place. VSS. Report given to RN. 

## 2020-12-04 NOTE — Progress Notes (Signed)
Reviewed pt hx of med record and updated.  Vitals DT

## 2020-12-04 NOTE — Patient Instructions (Signed)
Await results of stomach biopsies   Handout on diverticulosis given to you today   YOU HAD AN ENDOSCOPIC PROCEDURE TODAY AT THE [facility]:   Refer to the procedure report that was given to you for any specific questions about what was found during the examination.  If the procedure report does not answer your questions, please call your gastroenterologist to clarify.  If you requested that your care partner not be given the details of your procedure findings, then the procedure report has been included in a sealed envelope for you to review at your convenience later.  YOU SHOULD EXPECT: Some feelings of bloating in the abdomen. Passage of more gas than usual.  Walking can help get rid of the air that was put into your GI tract during the procedure and reduce the bloating. If you had a lower endoscopy (such as a colonoscopy or flexible sigmoidoscopy) you may notice spotting of blood in your stool or on the toilet paper. If you underwent a bowel prep for your procedure, you may not have a normal bowel movement for a few days.  Please Note:  You might notice some irritation and congestion in your nose or some drainage.  This is from the oxygen used during your procedure.  There is no need for concern and it should clear up in a day or so.  SYMPTOMS TO REPORT IMMEDIATELY:  Following lower endoscopy (colonoscopy or flexible sigmoidoscopy):  Excessive amounts of blood in the stool  Significant tenderness or worsening of abdominal pains  Swelling of the abdomen that is new, acute  Fever of 100F or higher  Following upper endoscopy (EGD)  Vomiting of blood or coffee ground material  New chest pain or pain under the shoulder blades  Painful or persistently difficult swallowing  New shortness of breath  Fever of 100F or higher  Black, tarry-looking stools  For urgent or emergent issues, a gastroenterologist can be reached at any hour by calling [phone]. Do not use  MyChart messaging for urgent concerns.    DIET:  We do recommend a small meal at first, but then you may proceed to your regular diet.  Drink plenty of fluids but you should avoid alcoholic beverages for 24 hours.  ACTIVITY:  You should plan to take it easy for the rest of today and you should NOT DRIVE or use heavy machinery until tomorrow (because of the sedation medicines used during the test).    FOLLOW UP: Our staff will call the number listed on your records 48-72 hours following your procedure to check on you and address any questions or concerns that you may have regarding the information given to you following your procedure. If we do not reach you, we will leave a message.  We will attempt to reach you two times.  During this call, we will ask if you have developed any symptoms of COVID 19. If you develop any symptoms (ie: fever, flu-like symptoms, shortness of breath, cough etc.) before then, please call [phone].  If you test positive for Covid 19 in the 2 weeks post procedure, please call and report this information to [place].    If any biopsies were taken you will be contacted by phone or by letter within the next 1-3 weeks.  Please call us at [phone] if you have not heard about the biopsies in 3 weeks.    SIGNATURES/CONFIDENTIALITY: You and/or your care partner have signed paperwork which will be entered into your electronic  medical record.  These signatures attest to the fact that that the information above on your After Visit Summary has been reviewed and is understood.  Full responsibility of the confidentiality of this discharge information lies with you and/or your care-partner.  

## 2020-12-06 ENCOUNTER — Telehealth: Payer: Self-pay

## 2020-12-06 NOTE — Telephone Encounter (Signed)
  Follow up Call-  Call back number 12/04/2020  Post procedure Call Back phone  # [phone]  Permission to leave phone message Yes  Some recent data might be hidden     Patient questions:  Do you have a fever, pain , or abdominal swelling? No. Pain Score  0 *  Have you tolerated food without any problems? Yes.    Have you been able to return to your normal activities? Yes.    Do you have any questions about your discharge instructions: Diet   No. Medications  No. Follow up visit  No.  Do you have questions or concerns about your Care? No.  Actions: * If pain score is 4 or above: No action needed, pain <4.

## 2020-12-09 ENCOUNTER — Ambulatory Visit: Payer: Self-pay | Admitting: *Deleted

## 2020-12-09 NOTE — Telephone Encounter (Signed)
If he wants sowles he will have to schedule her 1st available

## 2020-12-09 NOTE — Telephone Encounter (Signed)
Pts wife calling stating that the pt is experiencing pain on his left side x 2 months. She states that the pt is also having GI issues and is requesting to schedule an appt. She states that the pt did just have a colonoscopy. She states that the pt is calling after 3pm. Please advise.   Call to patient- patient is at work and can not have extended conversation- I advised him we would call back after 3 today.

## 2020-12-09 NOTE — Telephone Encounter (Signed)
Patient states he has had L sided pain for 2 months. Patient states he has lower back pain, tingling in feet,hand and fingers at times. Patient states he has discussed his symptoms with PCP and has seen GI for evaluation. No answer/diagnosis for symptoms. Patient advised I can schedule him for office follow up tomorrow- he really wants to see PCP- advised I have other appointments- he states he will check with employer and call back.  Reason for Disposition . [1] MILD pain (e.g., does not interfere with normal activities) AND [2] pain comes and goes (cramps) [3] present > 48 hours  (Exception: this same abdominal pain is a chronic symptom recurrent or ongoing AND present > 4 weeks)  Answer Assessment - Initial Assessment Questions 1. LOCATION: "Where does it hurt?"      Side- left rib area, tingling in legs, hands, feet 2. RADIATION: "Does the pain shoot anywhere else?" (e.g., chest, back)     yes 3. ONSET: "When did the pain begin?" (Minutes, hours or days ago)      2 months 4. SUDDEN: "Gradual or sudden onset?"     sudden 5. PATTERN "Does the pain come and go, or is it constant?"    - If constant: "Is it getting better, staying the same, or worsening?"      (Note: Constant means the pain never goes away completely; most serious pain is constant and it progresses)     - If intermittent: "How long does it last?" "Do you have pain now?"     (Note: Intermittent means the pain goes away completely between bouts)     Comes and goes- days-or 15-20 minutes 6. SEVERITY: "How bad is the pain?"  (e.g., Scale 1-10; mild, moderate, or severe)    - MILD (1-3): doesn't interfere with normal activities, abdomen soft and not tender to touch     - MODERATE (4-7): interferes with normal activities or awakens from sleep, abdomen tender to touch     - SEVERE (8-10): excruciating pain, doubled over, unable to do any normal activities       Mild/moderate 7. RECURRENT SYMPTOM: "Have you ever had this type of  stomach pain before?" If Yes, ask: "When was the last time?" and "What happened that time?"      Yes- ongoing 8. CAUSE: "What do you think is causing the stomach pain?"     unsure 9. RELIEVING/AGGRAVATING FACTORS: "What makes it better or worse?" (e.g., movement, antacids, bowel movement)     Sometimes feels gas and it brings on the pain 10. OTHER SYMPTOMS: "Do you have any other symptoms?" (e.g., back pain, diarrhea, fever, urination pain, vomiting)       Back- lower back pain  Protocols used: Abdominal Pain - Male-A-AH

## 2020-12-11 ENCOUNTER — Encounter: Payer: Self-pay | Admitting: Gastroenterology

## 2020-12-11 NOTE — Telephone Encounter (Signed)
Lvm for pt to call and schedule an appt with Dr Carlynn Purl first available

## 2020-12-12 NOTE — Progress Notes (Signed)
Name: Johnathan Eaton   MRN: [id number]    DOB: 10/18/1970   Date:12/13/2020       Progress Note  Subjective  Chief Complaint  Side Pain/ Leg Tingling  HPI  Left side chest pain: intermittent, once or twice a week, it can be any time of the day, does not wake him up from sleep. Not associated with diaphoresis, SOB or palpitation. He states he vomits acid in am's He was seen by GI and had normal EGD and colonoscopy, but symptoms seems to be GI related. He take chewable antacids. He feels like he has a bubble in his stomach. No fever or chills. He would like to hold off on seeing cardiologist Advised otc gas x and follow up with GI. He has noticed that pain improves when he has a bowel movement and also seems to have to use bathroom more often when he is nervous and anxious   Intermittent low back pain: he states it flares about every 3-4 months , it is on lower back , can last from a few days to two weeks. It sharp across lower back , does not radiate to his legs, but has intermittent numbness and tingling on anterior thighs not related to back pain. No rashes. He   Anxiety: he never started wellbutrin, does not want SSRI due to sexual side effects   Patient Active Problem List   Diagnosis Date Noted   Gout 04/18/2017   Allergic rhinitis, seasonal 02/09/2015   GAD (generalized anxiety disorder) 02/09/2015   Benign hypertension 02/09/2015   Blood glucose elevated 02/09/2015   Gastric reflux 02/09/2015   Obesity (BMI 30-39.9) 02/09/2015    Past Surgical History:  Procedure Laterality Date   COLONOSCOPY  12/04/2020   SHOULDER SURGERY Left    Rotator Cuff Repair   UPPER GASTROINTESTINAL ENDOSCOPY  12/04/2020   also has had in past in [place], 2007-2008    Family History  Problem Relation Age of Onset   Hypertension Mother    Dementia Mother    Lung cancer Mother        [name] through treatment   Pneumonia Mother        covid complications    Hypertension Father     Dementia Father    Hypertension Brother    Colon cancer Neg Hx    Esophageal cancer Neg Hx    Pancreatic cancer Neg Hx    Stomach cancer Neg Hx    Liver disease Neg Hx    Colon polyps Neg Hx    Rectal cancer Neg Hx     Social History   Tobacco Use   Smoking status: Never   Smokeless tobacco: Never  Substance Use Topics   Alcohol use: No    Alcohol/week: 0.0 standard drinks     Current Outpatient Medications:    ALPRAZolam (XANAX) 0.5 MG tablet, Take 1 tablet (0.5 mg total) by mouth 2 (two) times daily as needed for anxiety., Disp: 30 tablet, Rfl: 0   amLODipine-valsartan (EXFORGE) 5-160 MG tablet, Take 1 tablet by mouth daily., Disp: 90 tablet, Rfl: 1   buPROPion (WELLBUTRIN XL) 150 MG 24 hr tablet, Take 1 tablet (150 mg total) by mouth daily., Disp: 90 tablet, Rfl: 0   colchicine 0.6 MG tablet, TAKE 1 TABLET BY MOUTH DAILY AS NEEDEDED.TAKE 2 AT ONSET OF GOUT AND REPEAT 1 TIME AN HOUR AS NEEDED, Disp: 30 tablet, Rfl: 0   pantoprazole (PROTONIX) 40 MG tablet, TAKE 1 TABLET (40 MG TOTAL)  BY MOUTH DAILY BEFORE SUPPER., Disp: 90 tablet, Rfl: 1  No Known Allergies  I personally reviewed active problem list, medication list, allergies, family history, social history with the patient/caregiver today.   ROS  Constitutional: Negative for fever or weight change.  Respiratory: Negative for cough and shortness of breath.   Cardiovascular: Negative for chest pain or palpitations.  Gastrointestinal: Positive  for abdominal pain, and intermittent bowel changes.  Musculoskeletal: Negative for gait problem or joint swelling.  Skin: Negative for rash.  Neurological: Negative for dizziness or headache.  No other specific complaints in a complete review of systems (except as listed in HPI above).   Objective  Vitals:   12/13/20 0928  BP: 136/84  Pulse: 86  Resp: 16  Temp: 98.2 F (36.8 C)  TempSrc: Oral  SpO2: 97%  Weight: 215 lb (97.5 kg)  Height: 5\' 9"  (1.753 m)    Body mass  index is 31.75 kg/m.  Physical Exam  Constitutional: Patient appears well-developed and well-nourished. Obese  No distress.  HEENT: head atraumatic, normocephalic, pupils equal and reactive to light, neck supple Cardiovascular: Normal rate, regular rhythm and normal heart sounds.  No murmur heard. No BLE edema. Pulmonary/Chest: Effort normal and breath sounds normal. No respiratory distress. Abdominal: Soft.  There is no tenderness during palpation  Psychiatric: Patient has a normal mood and affect. behavior is normal. Judgment and thought content normal.   Recent Results (from the past 2160 hour(s))  Basic metabolic panel     Status: Abnormal   Collection Time: 10/22/20  2:18 PM  Result Value Ref Range   Sodium 138 135 - 145 mmol/L   Potassium 3.8 3.5 - 5.1 mmol/L   Chloride 101 98 - 111 mmol/L   CO2 26 22 - 32 mmol/L   Glucose, Bld 108 (H) 70 - 99 mg/dL    Comment: Glucose reference range applies only to samples taken after fasting for at least 8 hours.   BUN 11 6 - 20 mg/dL   Creatinine, Ser 10/24/20 0.61 - 1.24 mg/dL   Calcium 9.2 8.9 - 4.43 mg/dL   GFR, Estimated 15.4 >00 mL/min    Comment: (NOTE) Calculated using the CKD-EPI Creatinine Equation (2021)    Anion gap 11 5 - 15    Comment: Performed at [facility], [address], [place], [place]  CBC     Status: Abnormal   Collection Time: 10/22/20  2:18 PM  Result Value Ref Range   WBC 6.8 4.0 - 10.5 K/uL   RBC 5.97 (H) 4.22 - 5.81 MIL/uL   Hemoglobin 14.8 13.0 - 17.0 g/dL   HCT 10/24/20 09.3 - 26.7 %   MCV 78.4 (L) 80.0 - 100.0 fL   MCH 24.8 (L) 26.0 - 34.0 pg   MCHC 31.6 30.0 - 36.0 g/dL   RDW 12.4 58.0 - 99.8 %   Platelets 300 150 - 400 K/uL   nRBC 0.0 0.0 - 0.2 %    Comment: Performed at [facility], [address] [place], [place]  Troponin I (High Sensitivity)     Status: None   Collection Time: 10/22/20  2:18 PM  Result Value Ref Range   Troponin I (High Sensitivity) 4 <18 ng/L     Comment: (NOTE) Elevated high sensitivity troponin I (hsTnI) values and significant  changes across serial measurements may suggest ACS but many other  chronic and acute conditions are known to elevate hsTnI results.  Refer to the "Links"  section for chest pain algorithms and additional  guidance. Performed at [facility], [address] [place], [place] [address]   Troponin I (High Sensitivity)     Status: None   Collection Time: 10/22/20  9:09 PM  Result Value Ref Range   Troponin I (High Sensitivity) 3 <18 ng/L    Comment: (NOTE) Elevated high sensitivity troponin I (hsTnI) values and significant  changes across serial measurements may suggest ACS but many other  chronic and acute conditions are known to elevate hsTnI results.  Refer to the "Links" section for chest pain algorithms and additional  guidance. Performed at [facility], [address] [place], [place] [address]       PHQ2/9: Depression screen [facility] 2/9 12/13/2020 11/25/2020 05/17/2020 03/05/2020 10/18/2019  Decreased Interest 0 2 0 0 0  Down, Depressed, Hopeless 0 1 0 0 0  PHQ - 2 Score 0 3 0 0 0  Altered sleeping 0 0 1 - 0  Tired, decreased energy 2 1 1  - 0  Change in appetite 0 0 1 - 0  Feeling bad or failure about yourself  0 0 1 - 0  Trouble concentrating 0 0 1 - 0  Moving slowly or fidgety/restless 0 0 0 - 0  Suicidal thoughts 0 0 0 - 0  PHQ-9 Score 2 4 5  - 0  Difficult doing work/chores - Not difficult at all Somewhat difficult - Not difficult at all    phq 9 is negative   Fall Risk: Fall Risk  12/13/2020 11/25/2020 05/17/2020 03/05/2020 10/18/2019  Falls in the past year? 0 0 1 0 0  Number falls in past yr: 0 0 1 0 0  Injury with Fall? 0 0 0 0 0  Risk for fall due to : - - History of fall(s) - -  Follow up - - - Falls evaluation completed Falls evaluation completed      Functional Status Survey: Is the patient deaf or have difficulty hearing?: No Does the patient have difficulty  seeing, even when wearing glasses/contacts?: No Does the patient have difficulty concentrating, remembering, or making decisions?: No Does the patient have difficulty walking or climbing stairs?: No Does the patient have difficulty dressing or bathing?: No Does the patient have difficulty doing errands alone such as visiting a doctor's office or shopping?: No    Assessment & Plan  1. Intermittent left-sided chest pain  Likely from GI origin and stress We will give Tizanidine to take prn only with otc ibuprofen   2. Paresthesia of bilateral legs  - Vitamin B12  Explained since intermittent NCS may not be helpful   3. Intermittent low back pain  Discussed chiropractor care   4. Dyspepsia  Possible IBS , we will give materials to read, but needs to follow up with GI   5. Anxiety  Likely the root cause of a lot of his symptoms, explained importance of trying wellbutrin, consider mindfulness exercises , discussed walking meditation with patient and his wife

## 2020-12-13 ENCOUNTER — Ambulatory Visit (INDEPENDENT_AMBULATORY_CARE_PROVIDER_SITE_OTHER): Payer: BC Managed Care – PPO | Admitting: Family Medicine

## 2020-12-13 ENCOUNTER — Encounter: Payer: Self-pay | Admitting: Family Medicine

## 2020-12-13 ENCOUNTER — Other Ambulatory Visit: Payer: Self-pay

## 2020-12-13 VITALS — BP 136/84 | HR 86 | Temp 98.2°F | Resp 16 | Ht 69.0 in | Wt 215.0 lb

## 2020-12-13 DIAGNOSIS — R202 Paresthesia of skin: Secondary | ICD-10-CM

## 2020-12-13 DIAGNOSIS — M545 Low back pain, unspecified: Secondary | ICD-10-CM | POA: Diagnosis not present

## 2020-12-13 DIAGNOSIS — R1013 Epigastric pain: Secondary | ICD-10-CM

## 2020-12-13 DIAGNOSIS — R0789 Other chest pain: Secondary | ICD-10-CM | POA: Diagnosis not present

## 2020-12-13 DIAGNOSIS — F419 Anxiety disorder, unspecified: Secondary | ICD-10-CM

## 2020-12-13 MED ORDER — TIZANIDINE HCL 2 MG PO TABS: 2.0000 mg | ORAL_TABLET | Freq: Four times a day (QID) | ORAL | 0 refills | Status: DC | PRN

## 2020-12-13 NOTE — Patient Instructions (Signed)
Irritable Bowel Syndrome, Adult  Irritable bowel syndrome (IBS) is a group of symptoms that affects the organs responsible for digestion (gastrointestinal or GI tract). IBS is not one specific disease. To regulate how the GI tract works, the body sends signals back and forth between the intestines and the brain. If you have IBS, there may be a problem with these signals. As a result, the GI tract does not function normally. The intestines may become more sensitive and overreact to certain things. This maybe especially true when you eat certain foods or when you are under stress. There are four types of IBS. These may be determined based on the consistency of your stool (feces): IBS with diarrhea. IBS with constipation. Mixed IBS. Unsubtyped IBS. It is important to know which type of IBS you have. Certain treatments are morelikely to be helpful for certain types of IBS. What are the causes? The exact cause of IBS is not known. What increases the risk? You may have a higher risk for IBS if you: Are male. Are younger than 40. Have a family history of IBS. Have a mental health condition, such as depression, anxiety, or post-traumatic stress disorder. Have had a bacterial infection of your GI tract. What are the signs or symptoms? Symptoms of IBS vary from person to person. The main symptom is abdominal pain or discomfort. Other symptoms usually include one or more of the following: Diarrhea, constipation, or both. Abdominal swelling or bloating. Feeling full after eating a small or regular-sized meal. Frequent gas. Mucus in the stool. A feeling of having more stool left after a bowel movement. Symptoms tend to come and go. They may be triggered by stress, mental healthconditions, or certain foods. How is this diagnosed? This condition may be diagnosed based on a physical exam, your medical history, and your symptoms. You may have tests, such as: Blood tests. Stool test. X-rays. CT  scan. Colonoscopy. This is a procedure in which your GI tract is viewed with a long, thin, flexible tube. How is this treated? There is no cure for IBS, but treatment can help relieve symptoms. Treatment depends on the type of IBS you have, and may include: Changes to your diet, such as: Avoiding foods that cause symptoms. Drinking more water. Following a low-FODMAP (fermentable oligosaccharides, disaccharides, monosaccharides, and polyols) diet for up to 6 weeks, or as told by your health care provider. FODMAPs are sugars that are hard for some people to digest. Eating more fiber. Eating medium-sized meals at the same times every day. Medicines. These may include: Fiber supplements, if you have constipation. Medicine to control diarrhea (antidiarrheal medicines). Medicine to help control muscle tightening (spasms) in your GI tract (antispasmodic medicines). Medicines to help with mental health conditions, such as antidepressants or tranquilizers. Talk therapy or counseling. Working with a diet and nutrition specialist (dietitian) to help create a food plan that is right for you. Managing your stress. Follow these instructions at home: Eating and drinking Eat a healthy diet. Eat medium-sized meals at about the same time every day. Do not eat large meals. Gradually eat more fiber-rich foods. These include whole grains, fruits, and vegetables. This may be especially helpful if you have IBS with constipation. Eat a diet low in FODMAPs. Drink enough fluid to keep your urine pale yellow. Keep a journal of foods that seem to trigger symptoms. Avoid foods and drinks that: Contain added sugar. Make your symptoms worse. Dairy products, caffeinated drinks, and carbonated drinks can make symptoms worse for some   people. General instructions Take over-the-counter and prescription medicines and supplements only as told by your health care provider. Get enough exercise. Do at least 150 minutes of  moderate-intensity exercise each week. Manage your stress. Getting enough sleep and exercise can help you manage stress. Keep all follow-up visits as told by your health care provider and therapist. This is important. Alcohol Use Do not drink alcohol if: Your health care provider tells you not to drink. You are pregnant, may be pregnant, or are planning to become pregnant. If you drink alcohol, limit how much you have: 0-1 drink a day for women. 0-2 drinks a day for men. Be aware of how much alcohol is in your drink. In the [place] one drink equals one typical bottle of beer (12 oz), one-half glass of wine (5 oz), or one shot of hard liquor (1 oz). Contact a health care provider if you have: Constant pain. Weight loss. Difficulty or pain when swallowing. Diarrhea that gets worse. Get help right away if you have: Severe abdominal pain. Fever. Diarrhea with symptoms of dehydration, such as dizziness or dry mouth. Bright red blood in your stool. Stool that is black and tarry. Abdominal swelling. Vomiting that does not stop. Blood in your vomit. Summary Irritable bowel syndrome (IBS) is not one specific disease. It is a group of symptoms that affects digestion. Your intestines may become more sensitive and overreact to certain things. This may be especially true when you eat certain foods or when you are under stress. There is no cure for IBS, but treatment can help relieve symptoms. This information is not intended to replace advice given to you by your health care provider. Make sure you discuss any questions you have with your healthcare provider. Document Revised: 01/18/2020 Document Reviewed: 01/18/2020 Elsevier Patient Education  2022 [employer] Inc[employer]  

## 2020-12-14 LAB — VITAMIN B12: Vitamin B-12: 515 pg/mL (ref 200–1100)

## 2020-12-19 ENCOUNTER — Encounter: Payer: Self-pay | Admitting: Family Medicine

## 2020-12-20 ENCOUNTER — Other Ambulatory Visit: Payer: Self-pay | Admitting: Family Medicine

## 2020-12-20 DIAGNOSIS — R1013 Epigastric pain: Secondary | ICD-10-CM

## 2020-12-20 DIAGNOSIS — R1012 Left upper quadrant pain: Secondary | ICD-10-CM

## 2020-12-20 DIAGNOSIS — R0789 Other chest pain: Secondary | ICD-10-CM

## 2021-01-16 NOTE — Progress Notes (Deleted)
Name: Johnathan Eaton   MRN: [id number]    DOB: March 15, 1971   Date:01/16/2021       Progress Note  Subjective  Chief Complaint  Follow Up  HPI  Left side chest pain: intermittent, once or twice a week, it can be any time of the day, does not wake him up from sleep. Not associated with diaphoresis, SOB or palpitation. He states he vomits acid in am's He was seen by GI and had normal EGD and colonoscopy, but symptoms seems to be GI related. He take chewable antacids. He feels like he has a bubble in his stomach. No fever or chills. He would like to hold off on seeing cardiologist Advised otc gas x and follow up with GI. He has noticed that pain improves when he has a bowel movement and also seems to have to use bathroom more often when he is nervous and anxious   Intermittent low back pain: he states it flares about every 3-4 months , it is on lower back , can last from a few days to two weeks. It sharp across lower back , does not radiate to his legs, but has intermittent numbness and tingling on anterior thighs not related to back pain. No rashes. He   Anxiety: he never started wellbutrin, does not want SSRI due to sexual side effects  Patient Active Problem List   Diagnosis Date Noted   Gout 04/18/2017   Allergic rhinitis, seasonal 02/09/2015   GAD (generalized anxiety disorder) 02/09/2015   Benign hypertension 02/09/2015   Blood glucose elevated 02/09/2015   Gastric reflux 02/09/2015   Obesity (BMI 30-39.9) 02/09/2015    Past Surgical History:  Procedure Laterality Date   COLONOSCOPY  12/04/2020   SHOULDER SURGERY Left    Rotator Cuff Repair   UPPER GASTROINTESTINAL ENDOSCOPY  12/04/2020   also has had in past in [place], 2007[place]2008    Family History  Problem Relation Age of Onset   Hypertension Mother    Dementia Mother    Lung cancer Mother        [name] through treatment   Pneumonia Mother        covid complications    Hypertension Father    Dementia Father     Hypertension Brother    Colon cancer Neg Hx    Esophageal cancer Neg Hx    Pancreatic cancer Neg Hx    Stomach cancer Neg Hx    Liver disease Neg Hx    Colon polyps Neg Hx    Rectal cancer Neg Hx     Social History   Tobacco Use   Smoking status: Never   Smokeless tobacco: Never  Substance Use Topics   Alcohol use: No    Alcohol/week: 0.0 standard drinks     Current Outpatient Medications:    ALPRAZolam (XANAX) 0.5 MG tablet, Take 1 tablet (0.5 mg total) by mouth 2 (two) times daily as needed for anxiety., Disp: 30 tablet, Rfl: 0   amLODipine-valsartan (EXFORGE) 5-160 MG tablet, Take 1 tablet by mouth daily., Disp: 90 tablet, Rfl: 1   buPROPion (WELLBUTRIN XL) 150 MG 24 hr tablet, Take 1 tablet (150 mg total) by mouth daily., Disp: 90 tablet, Rfl: 0   colchicine 0.6 MG tablet, TAKE 1 TABLET BY MOUTH DAILY AS NEEDEDED.TAKE 2 AT ONSET OF GOUT AND REPEAT 1 TIME AN HOUR AS NEEDED, Disp: 30 tablet, Rfl: 0   pantoprazole (PROTONIX) 40 MG tablet, TAKE 1 TABLET (40 MG TOTAL) BY MOUTH DAILY  BEFORE SUPPER., Disp: 90 tablet, Rfl: 1   tiZANidine (ZANAFLEX) 2 MG tablet, Take 1 tablet (2 mg total) by mouth every 6 (six) hours as needed for muscle spasms., Disp: 30 tablet, Rfl: 0  No Known Allergies  I personally reviewed {Reviewed:14835} with the patient/caregiver today.   ROS  ***  Objective  There were no vitals filed for this visit.  There is no height or weight on file to calculate BMI.  Physical Exam ***  Recent Results (from the past 2160 hour(s))  Basic metabolic panel     Status: Abnormal   Collection Time: 10/22/20  2:18 PM  Result Value Ref Range   Sodium 138 135 - 145 mmol/L   Potassium 3.8 3.5 - 5.1 mmol/L   Chloride 101 98 - 111 mmol/L   CO2 26 22 - 32 mmol/L   Glucose, Bld 108 (H) 70 - 99 mg/dL    Comment: Glucose reference range applies only to samples taken after fasting for at least 8 hours.   BUN 11 6 - 20 mg/dL   Creatinine, Ser 2.70 0.61 - 1.24 mg/dL    Calcium 9.2 8.9 - 35.0 mg/dL   GFR, Estimated >09 >38 mL/min    Comment: (NOTE) Calculated using the CKD-EPI Creatinine Equation (2021)    Anion gap 11 5 - 15    Comment: Performed at [facility], [address] [place], [place] [address]  CBC     Status: Abnormal   Collection Time: 10/22/20  2:18 PM  Result Value Ref Range   WBC 6.8 4.0 - 10.5 K/uL   RBC 5.97 (H) 4.22 - 5.81 MIL/uL   Hemoglobin 14.8 13.0 - 17.0 g/dL   HCT 37.1 69.6 - 78.9 %   MCV 78.4 (L) 80.0 - 100.0 fL   MCH 24.8 (L) 26.0 - 34.0 pg   MCHC 31.6 30.0 - 36.0 g/dL   RDW 38.1 01.7 - 51.0 %   Platelets 300 150 - 400 K/uL   nRBC 0.0 0.0 - 0.2 %    Comment: Performed at [facility], [address] [place], [place] [address]  Troponin I (High Sensitivity)     Status: None   Collection Time: 10/22/20  2:18 PM  Result Value Ref Range   Troponin I (High Sensitivity) 4 <18 ng/L    Comment: (NOTE) Elevated high sensitivity troponin I (hsTnI) values and significant  changes across serial measurements may suggest ACS but many other  chronic and acute conditions are known to elevate hsTnI results.  Refer to the "Links" section for chest pain algorithms and additional  guidance. Performed at [facility], [address], [place], [place] [address]   Troponin I (High Sensitivity)     Status: None   Collection Time: 10/22/20  9:09 PM  Result Value Ref Range   Troponin I (High Sensitivity) 3 <18 ng/L    Comment: (NOTE) Elevated high sensitivity troponin I (hsTnI) values and significant  changes across serial measurements may suggest ACS but many other  chronic and acute conditions are known to elevate hsTnI results.  Refer to the "Links" section for chest pain algorithms and additional  guidance. Performed at [facility], [address], [place], [place] [address]   Vitamin B12     Status: None   Collection Time: 12/13/20 10:19 AM  Result Value Ref Range   Vitamin B-12 515 200 - 1,100  pg/mL    Diabetic Foot Exam: Diabetic Foot Exam -  Simple   No data filed    ***  PHQ2/9: Depression screen [facility] 2/9 12/13/2020 11/25/2020 05/17/2020 03/05/2020 10/18/2019  Decreased Interest 0 2 0 0 0  Down, Depressed, Hopeless 0 1 0 0 0  PHQ - 2 Score 0 3 0 0 0  Altered sleeping 0 0 1 - 0  Tired, decreased energy 2 1 1  - 0  Change in appetite 0 0 1 - 0  Feeling bad or failure about yourself  0 0 1 - 0  Trouble concentrating 0 0 1 - 0  Moving slowly or fidgety/restless 0 0 0 - 0  Suicidal thoughts 0 0 0 - 0  PHQ-9 Score 2 4 5  - 0  Difficult doing work/chores - Not difficult at all Somewhat difficult - Not difficult at all    phq 9 is {gen pos ***  Fall Risk: Fall Risk  12/13/2020 11/25/2020 05/17/2020 03/05/2020 10/18/2019  Falls in the past year? 0 0 1 0 0  Number falls in past yr: 0 0 1 0 0  Injury with Fall? 0 0 0 0 0  Risk for fall due to : - - History of fall(s) - -  Follow up - - - Falls evaluation completed Falls evaluation completed   ***   Functional Status Survey:   ***   Assessment & Plan  *** There are no diagnoses linked to this encounter.

## 2021-01-20 ENCOUNTER — Ambulatory Visit: Payer: BC Managed Care – PPO | Admitting: Family Medicine

## 2021-01-24 ENCOUNTER — Ambulatory Visit: Payer: BC Managed Care – PPO | Admitting: Gastroenterology

## 2021-02-24 ENCOUNTER — Other Ambulatory Visit: Payer: Self-pay

## 2021-02-24 ENCOUNTER — Encounter: Payer: Self-pay | Admitting: Family Medicine

## 2021-02-24 ENCOUNTER — Ambulatory Visit (INDEPENDENT_AMBULATORY_CARE_PROVIDER_SITE_OTHER): Payer: BC Managed Care – PPO | Admitting: Family Medicine

## 2021-02-24 VITALS — BP 136/82 | HR 90 | Temp 97.9°F | Resp 18 | Ht 69.5 in | Wt 212.3 lb

## 2021-02-24 DIAGNOSIS — R0789 Other chest pain: Secondary | ICD-10-CM | POA: Diagnosis not present

## 2021-02-24 DIAGNOSIS — I10 Essential (primary) hypertension: Secondary | ICD-10-CM

## 2021-02-24 DIAGNOSIS — K219 Gastro-esophageal reflux disease without esophagitis: Secondary | ICD-10-CM | POA: Diagnosis not present

## 2021-02-24 DIAGNOSIS — F341 Dysthymic disorder: Secondary | ICD-10-CM

## 2021-02-24 DIAGNOSIS — F411 Generalized anxiety disorder: Secondary | ICD-10-CM

## 2021-02-24 DIAGNOSIS — E785 Hyperlipidemia, unspecified: Secondary | ICD-10-CM

## 2021-02-24 DIAGNOSIS — R739 Hyperglycemia, unspecified: Secondary | ICD-10-CM

## 2021-02-24 MED ORDER — BUPROPION HCL ER (XL) 150 MG PO TB24: 150.0000 mg | ORAL_TABLET | Freq: Every day | ORAL | 0 refills | Status: DC

## 2021-02-24 MED ORDER — ALPRAZOLAM 0.5 MG PO TABS: 0.5000 mg | ORAL_TABLET | Freq: Two times a day (BID) | ORAL | 0 refills | Status: DC | PRN

## 2021-02-24 NOTE — Progress Notes (Signed)
Name: Johnathan Eaton   MRN: [id number]    DOB: 05/06/1971   Date:02/24/2021       Progress Note  Subjective  Chief Complaint  Follow Up  HPI  HTN: on Exforge and bp is at goal. No chest pain or palpitation.   GERD: seen by Gastroenterologist 09/09/2020 with worsening of symptoms , taking PPI once a day to control symptoms, he had normal EGD and colonoscopy 11/2020   Substernal chest pain: he was seen this Summer with increase in left side chest pain described as sharp feeling , more in his axilla area, occasionally on right side, associated with tingling of left arm and sometimes his legs . He went to [facility] and troponin negative. He also has noticed increase in GERD symptoms After his visit he called back because was still having symptoms and we placed referral for pancreas CT but he never got a call - today his pain is even higher, explained CT pancreas will not show anything and we should not order it at this time . He states symptoms not as intense now but continues to have pain about once a week and it can last about one hour per episode, discussed referral to cardiologist but he states that will just stress him out and he already went to [facility], he will call back if he changes his mind. Marland Kitchen He is not sure of the triggers. Weight is stable.    GAD/Depression: he states he resumed  Citalopram when his mother died 2019-07-21, but it  decreases his libido and makes him sleepy during the day therefore he  is off medication again .  He is still taking hydroxyzine and alprazolam prn and needs refills. We added Wellbutrin on his last visit and he states that it helps with his symptoms    Controlled Gout: uric acid level improve when he stopped HCTZ, no episodes since    Hyperglycemia: he denies polyphagia, polydipsia or polyuria, last hgbA1C was normal at 5.4 %. Unchanged   Dyslipidemia: he is not ready for therapy at this time   The 10-year ASCVD risk score (Arnett DK, et al., 2019) is: 9.7%   Values  used to calculate the score:     Age: 50 years     Sex: Male     Is Non-Hispanic African American: Yes     Diabetic: No     Tobacco smoker: No     Systolic Blood Pressure: 136 mmHg     Is BP treated: Yes     HDL Cholesterol: 46 mg/dL     Total Cholesterol: 152 mg/dL    Patient Active Problem List   Diagnosis Date Noted   Gout 04/18/2017   Allergic rhinitis, seasonal 02/09/2015   GAD (generalized anxiety disorder) 02/09/2015   Benign hypertension 02/09/2015   Blood glucose elevated 02/09/2015   Gastric reflux 02/09/2015   Obesity (BMI 30-39.9) 02/09/2015    Past Surgical History:  Procedure Laterality Date   COLONOSCOPY  12/04/2020   SHOULDER SURGERY Left    Rotator Cuff Repair   UPPER GASTROINTESTINAL ENDOSCOPY  12/04/2020   also has had in past in [place], 2007-2008    Family History  Problem Relation Age of Onset   Hypertension Mother    Dementia Mother    Lung cancer Mother        [name] through treatment   Pneumonia Mother        covid complications    Hypertension Father    Dementia  Father    Hypertension Brother    Colon cancer Neg Hx    Esophageal cancer Neg Hx    Pancreatic cancer Neg Hx    Stomach cancer Neg Hx    Liver disease Neg Hx    Colon polyps Neg Hx    Rectal cancer Neg Hx     Social History   Tobacco Use   Smoking status: Never   Smokeless tobacco: Never  Substance Use Topics   Alcohol use: No    Alcohol/week: 0.0 standard drinks     Current Outpatient Medications:    ALPRAZolam (XANAX) 0.5 MG tablet, Take 1 tablet (0.5 mg total) by mouth 2 (two) times daily as needed for anxiety., Disp: 30 tablet, Rfl: 0   amLODipine-valsartan (EXFORGE) 5-160 MG tablet, Take 1 tablet by mouth daily., Disp: 90 tablet, Rfl: 1   buPROPion (WELLBUTRIN XL) 150 MG 24 hr tablet, Take 1 tablet (150 mg total) by mouth daily., Disp: 90 tablet, Rfl: 0   colchicine 0.6 MG tablet, TAKE 1 TABLET BY MOUTH DAILY AS NEEDEDED.TAKE 2 AT ONSET OF GOUT AND REPEAT 1  TIME AN HOUR AS NEEDED, Disp: 30 tablet, Rfl: 0   pantoprazole (PROTONIX) 40 MG tablet, TAKE 1 TABLET (40 MG TOTAL) BY MOUTH DAILY BEFORE SUPPER., Disp: 90 tablet, Rfl: 1   tiZANidine (ZANAFLEX) 2 MG tablet, Take 1 tablet (2 mg total) by mouth every 6 (six) hours as needed for muscle spasms., Disp: 30 tablet, Rfl: 0  No Known Allergies  I personally reviewed active problem list, medication list, allergies, family history, social history, health maintenance with the patient/caregiver today.   ROS  Constitutional: Negative for fever or weight change.  Respiratory: Negative for cough and shortness of breath.   Cardiovascular: Negative for chest pain or palpitations.  Gastrointestinal: Negative for abdominal pain, no bowel changes.  Musculoskeletal: Negative for gait problem or joint swelling.  Skin: Negative for rash.  Neurological: Negative for dizziness or headache.  No other specific complaints in a complete review of systems (except as listed in HPI above).   Objective  Vitals:   02/24/21 1538  BP: 136/82  Pulse: 90  Resp: 18  Temp: 97.9 F (36.6 C)  TempSrc: Oral  SpO2: 98%  Weight: 212 lb 4.8 oz (96.3 kg)  Height: 5' 9.5" (1.765 m)    Body mass index is 30.9 kg/m.  Physical Exam  Constitutional: Patient appears well-developed and well-nourished. Obese  No distress.  HEENT: head atraumatic, normocephalic, pupils equal and reactive to light, neck supple Cardiovascular: Normal rate, regular rhythm and normal heart sounds.  No murmur heard. No BLE edema. Pulmonary/Chest: Effort normal and breath sounds normal. No respiratory distress. Abdominal: Soft.  There is no tenderness. Psychiatric: Patient has a normal mood and affect. behavior is normal. Judgment and thought content normal.   Recent Results (from the past 2160 hour(s))  Vitamin B12     Status: None   Collection Time: 12/13/20 10:19 AM  Result Value Ref Range   Vitamin B-12 515 200 - 1,100 pg/mL      PHQ2/9: Depression screen [facility] 2/9 02/24/2021 12/13/2020 11/25/2020 05/17/2020 03/05/2020  Decreased Interest 1 0 2 0 0  Down, Depressed, Hopeless 0 0 1 0 0  PHQ - 2 Score 1 0 3 0 0  Altered sleeping 1 0 0 1 -  Tired, decreased energy 1 2 1 1  -  Change in appetite 1 0 0 1 -  Feeling bad or failure about yourself  0 0  0 1 -  Trouble concentrating 0 0 0 1 -  Moving slowly or fidgety/restless 0 0 0 0 -  Suicidal thoughts 1 0 0 0 -  PHQ-9 Score 5 2 4 5  -  Difficult doing work/chores Somewhat difficult - Not difficult at all Somewhat difficult -  Some recent data might be hidden    phq 9 is positive   Fall Risk: Fall Risk  02/24/2021 12/13/2020 11/25/2020 05/17/2020 03/05/2020  Falls in the past year? 1 0 0 1 0  Number falls in past yr: 1 0 0 1 0  Injury with Fall? 0 0 0 0 0  Risk for fall due to : History of fall(s) - - History of fall(s) -  Follow up Falls prevention discussed - - - Falls evaluation completed     Functional Status Survey: Is the patient deaf or have difficulty hearing?: No Does the patient have difficulty seeing, even when wearing glasses/contacts?: Yes Does the patient have difficulty concentrating, remembering, or making decisions?: Yes Does the patient have difficulty walking or climbing stairs?: No Does the patient have difficulty dressing or bathing?: No Does the patient have difficulty doing errands alone such as visiting a doctor's office or shopping?: No    Assessment & Plan  1. Intermittent left-sided chest pain   2. GAD (generalized anxiety disorder)  - ALPRAZolam (XANAX) 0.5 MG tablet; Take 1 tablet (0.5 mg total) by mouth 2 (two) times daily as needed for anxiety.  Dispense: 30 tablet; Refill: 0 - buPROPion (WELLBUTRIN XL) 150 MG 24 hr tablet; Take 1 tablet (150 mg total) by mouth daily.  Dispense: 90 tablet; Refill: 0  3. GERD without esophagitis  Doing better, taking PPI once a day , normal EGD   4. Benign hypertension  At goal   5.  Dyslipidemia   6. Hyperglycemia   7. Dysthymia  - buPROPion (WELLBUTRIN XL) 150 MG 24 hr tablet; Take 1 tablet (150 mg total) by mouth daily.  Dispense: 90 tablet; Refill: 0

## 2021-05-23 ENCOUNTER — Ambulatory Visit: Payer: BC Managed Care – PPO | Admitting: Family Medicine

## 2021-05-28 NOTE — Progress Notes (Signed)
Name: Johnathan Eaton   MRN: [id number]    DOB: 1970/10/10   Date:05/29/2021       Progress Note  Subjective  Chief Complaint  Follow Up  HPI  HTN: on Exforge and bp is at goal. No chest pain or palpitation.   GERD: seen by Gastroenterologist 09/09/2020 with worsening of symptoms , taking PPI once a day to control symptoms, he had normal EGD and colonoscopy 11/2020   Substernal chest pain: he was seen this Summer with increase in left side chest pain described as sharp feeling , more in his axilla area, occasionally on right side, associated with tingling of left arm and sometimes his legs . He states improving, usually present when anxious . He feels jittery all over. He states sometimes feels like gas bubble in his chest.   GERD: he was seen by GI and had EGD and colonoscopy in July that was normal.    GAD/Depression: he states he resumed  Citalopram when his mother died 31-Jul-2019, but it  decreases his libido and makes him sleepy during the day therefore he is off medication again .He is was taking Wellbutrin and it helped but ran out and did not get it filled. He likes taking hydroxyzine but makes him sleepy during the day  and alprazolam prn . Advised hydroxyzine before bed, since waking up around 3 am. Wife came in today . Discussed therapy again     Controlled Gout: uric acid level improve when he stopped HCTZ. No problems    Hyperglycemia: he denies polyphagia, polydipsia or polyuria, last hgbA1C was normal at 5.4 %.Recheck yearly   Dyslipidemia: he is not ready for therapy at this time . We will recheck labs next visit   The 10-year ASCVD risk score (Arnett DK, et al., 2019) is: 9.5%   Values used to calculate the score:     Age: 50 years     Sex: Male     Is Non-Hispanic African American: Yes     Diabetic: No     Tobacco smoker: No     Systolic Blood Pressure: 134 mmHg     Is BP treated: Yes     HDL Cholesterol: 46 mg/dL     Total Cholesterol: 152 mg/dL   Patient  Active Problem List   Diagnosis Date Noted   Gout 04/18/2017   Allergic rhinitis, seasonal 02/09/2015   GAD (generalized anxiety disorder) 02/09/2015   Benign hypertension 02/09/2015   Blood glucose elevated 02/09/2015   Gastric reflux 02/09/2015   Obesity (BMI 30-39.9) 02/09/2015    Past Surgical History:  Procedure Laterality Date   COLONOSCOPY  12/04/2020   SHOULDER SURGERY Left    Rotator Cuff Repair   UPPER GASTROINTESTINAL ENDOSCOPY  12/04/2020   also has had in past in [place], 2007[place]2008    Family History  Problem Relation Age of Onset   Hypertension Mother    Dementia Mother    Lung cancer Mother        [name] through treatment   Pneumonia Mother        covid complications    Hypertension Father    Dementia Father    Hypertension Brother    Colon cancer Neg Hx    Esophageal cancer Neg Hx    Pancreatic cancer Neg Hx    Stomach cancer Neg Hx    Liver disease Neg Hx    Colon polyps Neg Hx    Rectal cancer Neg Hx  Social History   Tobacco Use   Smoking status: Never   Smokeless tobacco: Never  Substance Use Topics   Alcohol use: No    Alcohol/week: 0.0 standard drinks     Current Outpatient Medications:    ALPRAZolam (XANAX) 0.5 MG tablet, Take 1 tablet (0.5 mg total) by mouth 2 (two) times daily as needed for anxiety., Disp: 30 tablet, Rfl: 0   amLODipine-valsartan (EXFORGE) 5-160 MG tablet, Take 1 tablet by mouth daily., Disp: 90 tablet, Rfl: 1   buPROPion (WELLBUTRIN XL) 150 MG 24 hr tablet, Take 1 tablet (150 mg total) by mouth daily., Disp: 90 tablet, Rfl: 0   colchicine 0.6 MG tablet, TAKE 1 TABLET BY MOUTH DAILY AS NEEDEDED.TAKE 2 AT ONSET OF GOUT AND REPEAT 1 TIME AN HOUR AS NEEDED, Disp: 30 tablet, Rfl: 0   pantoprazole (PROTONIX) 40 MG tablet, TAKE 1 TABLET (40 MG TOTAL) BY MOUTH DAILY BEFORE SUPPER., Disp: 90 tablet, Rfl: 1   tiZANidine (ZANAFLEX) 2 MG tablet, Take 1 tablet (2 mg total) by mouth every 6 (six) hours as needed for muscle  spasms., Disp: 30 tablet, Rfl: 0  No Known Allergies  I personally reviewed active problem list, medication list, allergies, family history, social history, health maintenance with the patient/caregiver today.   ROS   Ten systems reviewed and is negative except as mentioned in HPI   Objective  Vitals:   05/29/21 1131  BP: 134/86  Pulse: 87  Resp: 16  Temp: 98.4 F (36.9 C)  SpO2: 98%  Weight: 215 lb (97.5 kg)  Height: 5\' 10"  (1.778 m)    Body mass index is 30.85 kg/m.  Physical Exam  Constitutional: Patient appears well-developed and well-nourished. Obese  No distress.  HEENT: head atraumatic, normocephalic, pupils equal and reactive to light, neck supple, throat within normal limits Cardiovascular: Normal rate, regular rhythm and normal heart sounds.  No murmur heard. No BLE edema. Pulmonary/Chest: Effort normal and breath sounds normal. No respiratory distress. Abdominal: Soft.  There is no tenderness. Psychiatric: Patient has a normal mood and affect. behavior is normal. Judgment and thought content normal.   PHQ2/9: Depression screen [facility] 2/9 05/29/2021 02/24/2021 12/13/2020 11/25/2020 05/17/2020  Decreased Interest 0 1 0 2 0  Down, Depressed, Hopeless 0 0 0 1 0  PHQ - 2 Score 0 1 0 3 0  Altered sleeping 2 1 0 0 1  Tired, decreased energy 2 1 2 1 1   Change in appetite 0 1 0 0 1  Feeling bad or failure about yourself  0 0 0 0 1  Trouble concentrating 0 0 0 0 1  Moving slowly or fidgety/restless 0 0 0 0 0  Suicidal thoughts 0 1 0 0 0  PHQ-9 Score 4 5 2 4 5   Difficult doing work/chores - Somewhat difficult - Not difficult at all Somewhat difficult  Some recent data might be hidden    phq 9 is negative   Fall Risk: Fall Risk  05/29/2021 02/24/2021 12/13/2020 11/25/2020 05/17/2020  Falls in the past year? 0 1 0 0 1  Number falls in past yr: 0 1 0 0 1  Injury with Fall? 0 0 0 0 0  Risk for fall due to : - History of fall(s) - - History of fall(s)  Follow up -  Falls prevention discussed - - -      Functional Status Survey: Is the patient deaf or have difficulty hearing?: No Does the patient have difficulty seeing, even when wearing glasses/contacts?:  No Does the patient have difficulty concentrating, remembering, or making decisions?: No Does the patient have difficulty walking or climbing stairs?: No Does the patient have difficulty dressing or bathing?: No Does the patient have difficulty doing errands alone such as visiting a doctor's office or shopping?: No    Assessment & Plan  1. Benign hypertension  - amLODipine-valsartan (EXFORGE) 5-160 MG tablet; Take 1 tablet by mouth daily. For blood pressure  Dispense: 90 tablet; Refill: 1  2. GAD (generalized anxiety disorder)  - ALPRAZolam (XANAX) 0.5 MG tablet; Take 1 tablet (0.5 mg total) by mouth 2 (two) times daily as needed for anxiety.  Dispense: 30 tablet; Refill: 0 - buPROPion (WELLBUTRIN XL) 150 MG 24 hr tablet; Take 1 tablet (150 mg total) by mouth every morning. For depression/energy  Dispense: 90 tablet; Refill: 1 - hydrOXYzine (ATARAX) 25 MG tablet; Take 1 tablet (25 mg total) by mouth at bedtime. For sleep/anxiety  Dispense: 90 tablet; Refill: 0  3. GERD without esophagitis  - pantoprazole (PROTONIX) 40 MG tablet; Take 1 tablet (40 mg total) by mouth daily before supper. For heartburn  Dispense: 90 tablet; Refill: 1  4. Hyperglycemia   5. Dyslipidemia   6. Dysthymia  - buPROPion (WELLBUTRIN XL) 150 MG 24 hr tablet; Take 1 tablet (150 mg total) by mouth every morning. For depression/energy  Dispense: 90 tablet; Refill: 1  7. Paresthesia of bilateral legs   8. Controlled gout  Doing well   9. Panic attack  - ALPRAZolam (XANAX) 0.5 MG tablet; Take 1 tablet (0.5 mg total) by mouth 2 (two) times daily as needed for anxiety.  Dispense: 30 tablet; Refill: 0

## 2021-05-29 ENCOUNTER — Encounter: Payer: Self-pay | Admitting: Family Medicine

## 2021-05-29 ENCOUNTER — Ambulatory Visit (INDEPENDENT_AMBULATORY_CARE_PROVIDER_SITE_OTHER): Payer: BC Managed Care – PPO | Admitting: Family Medicine

## 2021-05-29 ENCOUNTER — Other Ambulatory Visit: Payer: Self-pay

## 2021-05-29 VITALS — BP 134/86 | HR 87 | Temp 98.4°F | Resp 16 | Ht 70.0 in | Wt 215.0 lb

## 2021-05-29 DIAGNOSIS — F411 Generalized anxiety disorder: Secondary | ICD-10-CM | POA: Diagnosis not present

## 2021-05-29 DIAGNOSIS — K219 Gastro-esophageal reflux disease without esophagitis: Secondary | ICD-10-CM

## 2021-05-29 DIAGNOSIS — M109 Gout, unspecified: Secondary | ICD-10-CM

## 2021-05-29 DIAGNOSIS — R739 Hyperglycemia, unspecified: Secondary | ICD-10-CM

## 2021-05-29 DIAGNOSIS — E785 Hyperlipidemia, unspecified: Secondary | ICD-10-CM

## 2021-05-29 DIAGNOSIS — F410 Panic disorder [episodic paroxysmal anxiety] without agoraphobia: Secondary | ICD-10-CM

## 2021-05-29 DIAGNOSIS — I10 Essential (primary) hypertension: Secondary | ICD-10-CM

## 2021-05-29 DIAGNOSIS — F341 Dysthymic disorder: Secondary | ICD-10-CM

## 2021-05-29 DIAGNOSIS — R202 Paresthesia of skin: Secondary | ICD-10-CM

## 2021-05-29 MED ORDER — ALPRAZOLAM 0.5 MG PO TABS
0.5000 mg | ORAL_TABLET | Freq: Two times a day (BID) | ORAL | 0 refills | Status: DC | PRN
Start: 2021-05-29 — End: 2021-09-26

## 2021-05-29 MED ORDER — PANTOPRAZOLE SODIUM 40 MG PO TBEC: 40.0000 mg | DELAYED_RELEASE_TABLET | Freq: Every day | ORAL | 1 refills | Status: DC

## 2021-05-29 MED ORDER — AMLODIPINE BESYLATE-VALSARTAN 5-160 MG PO TABS: 1.0000 | ORAL_TABLET | Freq: Every day | ORAL | 1 refills | Status: DC

## 2021-05-29 MED ORDER — BUPROPION HCL ER (XL) 150 MG PO TB24: 150.0000 mg | ORAL_TABLET | ORAL | 1 refills | Status: DC

## 2021-05-29 MED ORDER — HYDROXYZINE HCL 25 MG PO TABS: 25.0000 mg | ORAL_TABLET | Freq: Every day | ORAL | 0 refills | Status: DC

## 2021-07-14 ENCOUNTER — Ambulatory Visit
Admission: RE | Admit: 2021-07-14 | Discharge: 2021-07-14 | Disposition: A | Discharge status: Home / Self Care | Payer: BC Managed Care – PPO | Attending: Physician Assistant | Admitting: Physician Assistant

## 2021-07-14 ENCOUNTER — Other Ambulatory Visit: Payer: Self-pay

## 2021-07-14 ENCOUNTER — Ambulatory Visit: Payer: BC Managed Care – PPO | Admitting: Physician Assistant

## 2021-07-14 ENCOUNTER — Encounter: Payer: Self-pay | Admitting: Physician Assistant

## 2021-07-14 ENCOUNTER — Ambulatory Visit
Admission: RE | Admit: 2021-07-14 | Discharge: 2021-07-14 | Disposition: A | Discharge status: Home / Self Care | Payer: BC Managed Care – PPO | Source: Ambulatory Visit | Attending: Physician Assistant | Admitting: Physician Assistant

## 2021-07-14 VITALS — BP 132/84 | HR 98 | Temp 98.5°F | Resp 16 | Ht 70.0 in | Wt 215.5 lb

## 2021-07-14 DIAGNOSIS — M25551 Pain in right hip: Secondary | ICD-10-CM | POA: Diagnosis not present

## 2021-07-14 MED ORDER — MELOXICAM 15 MG PO TABS: 15.0000 mg | ORAL_TABLET | Freq: Every day | ORAL | 0 refills | Status: DC

## 2021-07-14 NOTE — Patient Instructions (Signed)
Based on your symptoms I have ordered an Xray of your hip to see if there is an underlying injury to the joint We will call you with those results when they are available I am also sending in a script for Meloxicam 15 mg to be taken by mouth once per day to help with pain I would also like you to try Physical Therapy as long as there are not issues on the xray PT is a great service that can help with a variety of muscle and skeletal injuries  It was nice to meet you and I appreciate the opportunity to be involved in your care

## 2021-07-14 NOTE — Assessment & Plan Note (Signed)
Acute, new problem, appears stable but unmanaged Suspect this is more muscular but recommend DG hip to rule out bony injury or degenerative disease in joint  Recommend using Meloxicam 15mg  PO QD for pain management  Will refer to PT for assessment and further management for muscular etiology If imaging reveals bony issue will refer to Orthopedics for further management  Follow up as needed for persisting symptoms
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
## 2021-07-21 ENCOUNTER — Ambulatory Visit: Payer: Self-pay

## 2021-07-21 NOTE — Telephone Encounter (Signed)
Summary: requesting medication for pain in groin area   Patient called in asking for some steroids for the pain he saw Erin Mecum for on 07/14/21  about a week ago say that he is still having the pain and need some relief. Please advise and call patient at Ph# [phone]      Chief Complaint: Pain right hip Symptoms: Pain 5/10 Frequency: 10 days ago Pertinent Negatives: Patient denies  Disposition: [] ED /[] Urgent Care (no appt availability in office) / [] Appointment(In office/virtual)/ []  [facility]/ [] Home Care/ [] Refused Recommended Disposition /[] [facility]/ []  Follow-up with PCP Additional Notes: Pt. States Meloxicam is not helping. Asking for a steroid. Please advise pt.  Answer Assessment - Initial Assessment Questions 1. NAME of MEDICATION: "What medicine are you calling about?"     Meloxicam has not helped 2. QUESTION: "What is your question?" (e.g., double dose of medicine, side effect)     Not helping 3. PRESCRIBING HCP: "Who prescribed it?" Reason: if prescribed by specialist, call should be referred to that group.     Mecum 4. SYMPTOMS: "Do you have any symptoms?"     Pain  5/10 5. SEVERITY: If symptoms are present, ask "Are they mild, moderate or severe?"     N/a 6. PREGNANCY:  "Is there any chance that you are pregnant?" "When was your last menstrual period?"     N/A  Protocols used: Medication Question Call-A-AH
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
## 2021-09-03 ENCOUNTER — Ambulatory Visit: Payer: BC Managed Care – PPO | Admitting: Family Medicine

## 2021-09-05 ENCOUNTER — Other Ambulatory Visit: Payer: Self-pay | Admitting: Family Medicine

## 2021-09-05 DIAGNOSIS — F411 Generalized anxiety disorder: Secondary | ICD-10-CM

## 2021-09-26 ENCOUNTER — Encounter: Payer: Self-pay | Admitting: Family Medicine

## 2021-09-26 ENCOUNTER — Ambulatory Visit (INDEPENDENT_AMBULATORY_CARE_PROVIDER_SITE_OTHER): Payer: BC Managed Care – PPO | Admitting: Family Medicine

## 2021-09-26 VITALS — BP 134/86 | HR 90 | Resp 16 | Ht 69.0 in | Wt 217.0 lb

## 2021-09-26 DIAGNOSIS — F411 Generalized anxiety disorder: Secondary | ICD-10-CM | POA: Diagnosis not present

## 2021-09-26 DIAGNOSIS — F341 Dysthymic disorder: Secondary | ICD-10-CM

## 2021-09-26 DIAGNOSIS — I10 Essential (primary) hypertension: Secondary | ICD-10-CM | POA: Diagnosis not present

## 2021-09-26 DIAGNOSIS — K219 Gastro-esophageal reflux disease without esophagitis: Secondary | ICD-10-CM

## 2021-09-26 DIAGNOSIS — F410 Panic disorder [episodic paroxysmal anxiety] without agoraphobia: Secondary | ICD-10-CM

## 2021-09-26 DIAGNOSIS — M109 Gout, unspecified: Secondary | ICD-10-CM

## 2021-09-26 DIAGNOSIS — E785 Hyperlipidemia, unspecified: Secondary | ICD-10-CM

## 2021-09-26 MED ORDER — AMLODIPINE BESYLATE-VALSARTAN 5-160 MG PO TABS: 1.0000 | ORAL_TABLET | Freq: Every day | ORAL | 1 refills | Status: DC

## 2021-09-26 MED ORDER — BUPROPION HCL ER (XL) 150 MG PO TB24: 150.0000 mg | ORAL_TABLET | ORAL | 1 refills | Status: DC

## 2021-09-26 MED ORDER — HYDROXYZINE HCL 25 MG PO TABS: 25.0000 mg | ORAL_TABLET | Freq: Every day | ORAL | 1 refills | Status: DC

## 2021-09-26 MED ORDER — ALPRAZOLAM 0.5 MG PO TABS: 0.5000 mg | ORAL_TABLET | Freq: Two times a day (BID) | ORAL | 0 refills | Status: DC | PRN

## 2021-09-26 MED ORDER — PANTOPRAZOLE SODIUM 40 MG PO TBEC: 40.0000 mg | DELAYED_RELEASE_TABLET | Freq: Every day | ORAL | 1 refills | Status: DC

## 2021-09-26 NOTE — Progress Notes (Signed)
Name: Johnathan Eaton   MRN: [id number]    DOB: Feb 16, 1971   Date:09/26/2021 ? ?     Progress Note ? ?Subjective ? ?Chief Complaint ? ?Follow up ? ?HPI ? ?HTN: on Exforge and bp is at goal. No chest pain or palpitation.  Continue medications  ? ?GERD: seen by Gastroenterologist 09/09/2020 with worsening of symptoms , taking PPI once a day to control symptoms, he had normal EGD and colonoscopy 11/2020. He avoids beef, pork, fried food and spicy food   ?  ?GAD/Depression: he states he resumed  Citalopram when his mother died Aug 08, 2019, but it  decreases his libido and makes him sleepy during the day therefore he is off medication again .He is back on Wellburin, hydroxizine once a day and alprazolam prn only for panic attacks and lasting at lest 4 months. Feeling well today  ?  ?Controlled Gout: uric acid level improve when he stopped HCTZ. No problems since  ?  ?Hyperglycemia: he denies polyphagia, polydipsia or polyuria, last hgbA1C was normal at 5.4 %, and we will recheck it during his CPE ? ?Dyslipidemia: he is not ready for therapy at this time . We will recheck labs on his next visit  ? ?The 10-year ASCVD risk score (Arnett DK, et al., 2019) is: 9.9% ?  Values used to calculate the score: ?    Age: 5 years ?    Sex: Male ?    Is Non-Hispanic African American: Yes ?    Diabetic: No ?    Tobacco smoker: No ?    Systolic Blood Pressure: 134 mmHg ?    Is BP treated: Yes ?    HDL Cholesterol: 46 mg/dL ?    Total Cholesterol: 152 mg/dL   ? ? ?Patient Active Problem List  ? Diagnosis Date Noted  ? Acute right hip pain 07/14/2021  ? Gout 04/18/2017  ? Allergic rhinitis, seasonal 02/09/2015  ? GAD (generalized anxiety disorder) 02/09/2015  ? Benign hypertension 02/09/2015  ? Blood glucose elevated 02/09/2015  ? Gastric reflux 02/09/2015  ? Obesity (BMI 30-39.9) 02/09/2015  ? ? ?Past Surgical History:  ?Procedure Laterality Date  ? COLONOSCOPY  12/04/2020  ? SHOULDER SURGERY Left   ? Rotator Cuff Repair  ? UPPER  GASTROINTESTINAL ENDOSCOPY  12/04/2020  ? also has had in past in [place], 2007[place]  ? ? ?Family History  ?Problem Relation Age of Onset  ? Hypertension Mother   ? Dementia Mother   ? Lung cancer Mother   ?     Went through treatment  ? Pneumonia Mother   ?     covid complications   ? Hypertension Father   ? Dementia Father   ? Hypertension Brother   ? Colon cancer Neg Hx   ? Esophageal cancer Neg Hx   ? Pancreatic cancer Neg Hx   ? Stomach cancer Neg Hx   ? Liver disease Neg Hx   ? Colon polyps Neg Hx   ? Rectal cancer Neg Hx   ? ? ?Social History  ? ?Tobacco Use  ? Smoking status: Never  ? Smokeless tobacco: Never  ?Substance Use Topics  ? Alcohol use: No  ?  Alcohol/week: 0.0 standard drinks  ? ? ? ?Current Outpatient Medications:  ?  ALPRAZolam (XANAX) 0.5 MG tablet, Take 1 tablet (0.5 mg total) by mouth 2 (two) times daily as needed for anxiety., Disp: 30 tablet, Rfl: 0 ?  amLODipine-valsartan (EXFORGE) 5-160 MG tablet, Take 1 tablet by  mouth daily. For blood pressure, Disp: 90 tablet, Rfl: 1 ?  buPROPion (WELLBUTRIN XL) 150 MG 24 hr tablet, Take 1 tablet (150 mg total) by mouth every morning. For depression/energy, Disp: 90 tablet, Rfl: 1 ?  colchicine 0.6 MG tablet, TAKE 1 TABLET BY MOUTH DAILY AS NEEDEDED.TAKE 2 AT ONSET OF GOUT AND REPEAT 1 TIME AN HOUR AS NEEDED, Disp: 30 tablet, Rfl: 0 ?  hydrOXYzine (ATARAX) 25 MG tablet, TAKE 1 TABLET (25 MG TOTAL) BY MOUTH AT BEDTIME. FOR SLEEP/ANXIETY, Disp: 90 tablet, Rfl: 0 ?  meloxicam (MOBIC) 15 MG tablet, Take 1 tablet (15 mg total) by mouth daily., Disp: 30 tablet, Rfl: 0 ?  pantoprazole (PROTONIX) 40 MG tablet, Take 1 tablet (40 mg total) by mouth daily before supper. For heartburn, Disp: 90 tablet, Rfl: 1 ? ?No Known Allergies ? ?I personally reviewed active problem list, medication list, allergies, family history, social history with the patient/caregiver today. ? ? ?ROS ? ?Constitutional: Negative for fever or weight change.  ?Respiratory: Negative for  cough and shortness of breath.   ?Cardiovascular: Negative for chest pain or palpitations.  ?Gastrointestinal: Negative for abdominal pain, no bowel changes.  ?Musculoskeletal: Negative for gait problem or joint swelling.  ?Skin: Negative for rash.  ?Neurological: Negative for dizziness or headache.  ?No other specific complaints in a complete review of systems (except as listed in HPI above).  ? ?Objective ? ?Vitals:  ? 09/26/21 1545  ?BP: 134/86  ?Pulse: 90  ?Resp: 16  ?SpO2: 99%  ?Weight: 217 lb (98.4 kg)  ?Height: 5\' 9"  (1.753 m)  ? ? ?Body mass index is 32.05 kg/m?. ? ?Physical Exam ? ?Constitutional: Patient appears well-developed and well-nourished. Obese  No distress.  ?HEENT: head atraumatic, normocephalic, pupils equal and reactive to light, neck supple, throat within normal limits ?Cardiovascular: Normal rate, regular rhythm and normal heart sounds.  No murmur heard. No BLE edema. ?Pulmonary/Chest: Effort normal and breath sounds normal. No respiratory distress. ?Abdominal: Soft.  There is no tenderness. ?Psychiatric: Patient has a normal mood and affect. behavior is normal. Judgment and thought content normal.  ? ?PHQ2/9: ? ?  09/26/2021  ?  3:45 PM 07/14/2021  ?  8:56 AM 05/29/2021  ? 11:30 AM 02/24/2021  ?  3:49 PM 12/13/2020  ?  9:27 AM  ?Depression screen PHQ 2/9  ?Decreased Interest 0 0 0 1 0  ?Down, Depressed, Hopeless 0 0 0 0 0  ?PHQ - 2 Score 0 0 0 1 0  ?Altered sleeping 0 0 2 1 0  ?Tired, decreased energy 0 0 2 1 2   ?Change in appetite 0 0 0 1 0  ?Feeling bad or failure about yourself  0 0 0 0 0  ?Trouble concentrating 0 0 0 0 0  ?Moving slowly or fidgety/restless 0 0 0 0 0  ?Suicidal thoughts 0 0 0 1 0  ?PHQ-9 Score 0 0 4 5 2   ?Difficult doing work/chores  Not difficult at all  Somewhat difficult   ?  ?phq 9 is negative ? ? ?Fall Risk: ? ?  09/26/2021  ?  3:45 PM 07/14/2021  ?  8:56 AM 05/29/2021  ? 11:30 AM 02/24/2021  ?  3:49 PM 12/13/2020  ?  9:24 AM  ?Fall Risk   ?Falls in the past year? 1 0 0 1 0   ?Number falls in past yr: 0 0 0 1 0  ?Injury with Fall? 1 0 0 0 0  ?Risk for fall due to : No  Fall Risks No Fall Risks  History of fall(s)   ?Follow up Falls prevention discussed Falls prevention discussed  Falls prevention discussed   ? ? ? ? ?Functional Status Survey: ?Is the patient deaf or have difficulty hearing?: No ?Does the patient have difficulty seeing, even when wearing glasses/contacts?: No ?Does the patient have difficulty concentrating, remembering, or making decisions?: No ?Does the patient have difficulty walking or climbing stairs?: No ?Does the patient have difficulty dressing or bathing?: No ?Does the patient have difficulty doing errands alone such as visiting a doctor's office or shopping?: No ? ? ? ?Assessment & Plan ? ?1. Benign hypertension ? ?- amLODipine-valsartan (EXFORGE) 5-160 MG tablet; Take 1 tablet by mouth daily. For blood pressure  Dispense: 90 tablet; Refill: 1 ? ?2. GAD (generalized anxiety disorder) ? ?- ALPRAZolam (XANAX) 0.5 MG tablet; Take 1 tablet (0.5 mg total) by mouth 2 (two) times daily as needed for anxiety.  Dispense: 30 tablet; Refill: 0 ?- buPROPion (WELLBUTRIN XL) 150 MG 24 hr tablet; Take 1 tablet (150 mg total) by mouth every morning. For depression/energy  Dispense: 90 tablet; Refill: 1 ?- hydrOXYzine (ATARAX) 25 MG tablet; Take 1 tablet (25 mg total) by mouth daily at 12 noon. For sleep/anxiety  Dispense: 90 tablet; Refill: 1 ? ?3. Panic attack ? ?- ALPRAZolam (XANAX) 0.5 MG tablet; Take 1 tablet (0.5 mg total) by mouth 2 (two) times daily as needed for anxiety.  Dispense: 30 tablet; Refill: 0 ? ?4. Dysthymia ? ?- buPROPion (WELLBUTRIN XL) 150 MG 24 hr tablet; Take 1 tablet (150 mg total) by mouth every morning. For depression/energy  Dispense: 90 tablet; Refill: 1 ? ?5. GERD without esophagitis ? ?- pantoprazole (PROTONIX) 40 MG tablet; Take 1 tablet (40 mg total) by mouth daily before supper. For heartburn  Dispense: 90 tablet; Refill: 1 ? ?6. Controlled  gout ? ? ?7. Dyslipidemia ?  ? ?

## 2021-12-13 ENCOUNTER — Other Ambulatory Visit: Payer: Self-pay | Admitting: Physician Assistant

## 2021-12-13 DIAGNOSIS — M25551 Pain in right hip: Secondary | ICD-10-CM

## 2021-12-15 NOTE — Telephone Encounter (Signed)
D/C 09/26/21. Requested Prescriptions  Refused Prescriptions Disp Refills  . meloxicam (MOBIC) 15 MG tablet [Pharmacy Med Name: MELOXICAM 15 MG TABLET] 30 tablet 0    Sig: TAKE 1 TABLET (15 MG TOTAL) BY MOUTH DAILY.     Analgesics:  COX2 Inhibitors Failed - 12/13/2021 10:35 AM      Failed - Manual Review: Labs are only required if the patient has taken medication for more than 8 weeks.      Failed - HGB in normal range and within 360 days    Hemoglobin  Date Value Ref Range Status  10/22/2020 14.8 13.0 - 17.0 g/dL Final         Failed - Cr in normal range and within 360 days    Creat  Date Value Ref Range Status  08/13/2020 1.13 0.70 - 1.33 mg/dL Final    Comment:    For patients >51 years of age, the reference limit for Creatinine is approximately 13% higher for people identified as African-American. .    Creatinine, Ser  Date Value Ref Range Status  10/22/2020 1.09 0.61 - 1.24 mg/dL Final         Failed - HCT in normal range and within 360 days    HCT  Date Value Ref Range Status  10/22/2020 46.8 39.0 - 52.0 % Final         Failed - AST in normal range and within 360 days    AST  Date Value Ref Range Status  08/13/2020 16 10 - 35 U/L Final         Failed - ALT in normal range and within 360 days    ALT  Date Value Ref Range Status  08/13/2020 21 9 - 46 U/L Final         Failed - eGFR is 30 or above and within 360 days    GFR, Est African American  Date Value Ref Range Status  08/13/2020 87 > OR = 60 mL/min/1.62m2 Final   GFR, Est Non African American  Date Value Ref Range Status  08/13/2020 75 > OR = 60 mL/min/1.80m2 Final   GFR, Estimated  Date Value Ref Range Status  10/22/2020 >60 >60 mL/min Final    Comment:    (NOTE) Calculated using the CKD-EPI Creatinine Equation (2021)          Passed - Patient is not pregnant      Passed - Valid encounter within last 12 months    Recent Outpatient Visits          2 months ago Benign hypertension    [facility], Drue Stager, MD   5 months ago Acute right hip pain   [place], Dani Gobble, PA-C   6 months ago Benign hypertension   [facility] [place], Drue Stager, MD   9 months ago Intermittent left-sided chest pain   [facility] Steele Sizer, MD   1 year ago Intermittent left-sided chest pain   [facility] Steele Sizer, MD      Future Appointments            In 1 month Steele Sizer, MD [facility], [place]   In 3 months Steele Sizer, MD [facility], [place] [facility]

## 2022-01-29 NOTE — Progress Notes (Deleted)
Name: Johnathan Eaton   MRN: [id number]    DOB: 01-16-1971   Date:01/29/2022       Progress Note  Subjective  Chief Complaint  Annual Exam  HPI  Patient presents for annual CPE.  IPSS Questionnaire (AUA-7): Over the past month.   1)  How often have you had a sensation of not emptying your bladder completely after you finish urinating?  {Rating:19227}  2)  How often have you had to urinate again less than two hours after you finished urinating? {Rating:19227}  3)  How often have you found you stopped and started again several times when you urinated?  {Rating:19227}  4) How difficult have you found it to postpone urination?  {Rating:19227}  5) How often have you had a weak urinary stream?  {Rating:19227}  6) How often have you had to push or strain to begin urination?  {Rating:19227}  7) How many times did you most typically get up to urinate from the time you went to bed until the time you got up in the morning?  {Rating:19228}  Total score:  0-7 mildly symptomatic   8-19 moderately symptomatic   20-35 severely symptomatic     Diet: *** Exercise: *** Last Dental Exam: **** Last Eye Exam: ***  Depression: phq 9 is {gen pos neg:315643}    09/26/2021    3:45 PM 07/14/2021    8:56 AM 05/29/2021   11:30 AM 02/24/2021    3:49 PM 12/13/2020    9:27 AM  Depression screen PHQ 2/9  Decreased Interest 0 0 0 1 0  Down, Depressed, Hopeless 0 0 0 0 0  PHQ - 2 Score 0 0 0 1 0  Altered sleeping 0 0 2 1 0  Tired, decreased energy 0 0 2 1 2   Change in appetite 0 0 0 1 0  Feeling bad or failure about yourself  0 0 0 0 0  Trouble concentrating 0 0 0 0 0  Moving slowly or fidgety/restless 0 0 0 0 0  Suicidal thoughts 0 0 0 1 0  PHQ-9 Score 0 0 4 5 2   Difficult doing work/chores  Not difficult at all  Somewhat difficult     Hypertension:  BP Readings from Last 3 Encounters:  09/26/21 134/86  07/14/21 132/84  05/29/21 134/86    Obesity: Wt Readings from Last 3 Encounters:   09/26/21 217 lb (98.4 kg)  07/14/21 215 lb 8 oz (97.8 kg)  05/29/21 215 lb (97.5 kg)   BMI Readings from Last 3 Encounters:  09/26/21 32.05 kg/m  07/14/21 30.92 kg/m  05/29/21 30.85 kg/m     Lipids:  Lab Results  Component Value Date   CHOL 152 08/13/2020   CHOL 147 06/15/2019   CHOL 138 04/16/2017   Lab Results  Component Value Date   HDL 46 08/13/2020   HDL 45 06/15/2019   HDL 46 04/16/2017   Lab Results  Component Value Date   LDLCALC 89 08/13/2020   LDLCALC 87 06/15/2019   LDLCALC 71 04/16/2017   Lab Results  Component Value Date   TRIG 78 08/13/2020   TRIG 60 06/15/2019   TRIG 125 04/16/2017   Lab Results  Component Value Date   CHOLHDL 3.3 08/13/2020   CHOLHDL 3.3 06/15/2019   CHOLHDL 3.0 04/16/2017   No results found for: "LDLDIRECT" Glucose:  Glucose, Bld  Date Value Ref Range Status  10/22/2020 108 (H) 70 - 99 mg/dL Final    Comment:    Glucose reference  range applies only to samples taken after fasting for at least 8 hours.  08/13/2020 131 (H) 65 - 99 mg/dL Final    Comment:    .            Fasting reference interval . For someone without known diabetes, a glucose value >125 mg/dL indicates that they may have diabetes and this should be confirmed with a follow-up test. .   06/15/2019 108 (H) 65 - 99 mg/dL Final    Comment:    .            Fasting reference interval . For someone without known diabetes, a glucose value between 100 and 125 mg/dL is consistent with prediabetes and should be confirmed with a follow-up test. .     Flowsheet Row Office Visit from 05/17/2020 in [facility]  AUDIT-C Score 0      Married STD testing and prevention (HIV/chl/gon/syphilis): 10/23/15 Sexual history:  Hep C Screening: 06/15/19 Skin cancer: Discussed monitoring for atypical lesions Colorectal cancer: 12/04/20 Prostate cancer:   Lab Results  Component Value Date   PSA Normal, 0.4 11/29/2009     Lung cancer:  Low  Dose CT Chest recommended if Age 25-80 years, 30 pack-year currently smoking OR have quit w/in 15years. Patient  no a candidate for screening   AAA: The USPSTF recommends one-time screening with ultrasonography in men ages 75 to 75 years who have ever smoked. Patient   no, a candidate for screening  ECG:  10/23/20  Vaccines:   HPV: N/A Tdap: up to date Shingrix: N/A Pneumonia: N/A Flu: N/A COVID-19: N/A  Advanced Care Planning: A voluntary discussion about advance care planning including the explanation and discussion of advance directives.  Discussed health care proxy and Living will, and the patient was able to identify a health care proxy as ***.  Patient does not have a living will and power of attorney of health care   Patient Active Problem List   Diagnosis Date Noted   Acute right hip pain 07/14/2021   Gout 04/18/2017   Allergic rhinitis, seasonal 02/09/2015   GAD (generalized anxiety disorder) 02/09/2015   Benign hypertension 02/09/2015   Blood glucose elevated 02/09/2015   Gastric reflux 02/09/2015   Obesity (BMI 30-39.9) 02/09/2015    Past Surgical History:  Procedure Laterality Date   COLONOSCOPY  12/04/2020   SHOULDER SURGERY Left    Rotator Cuff Repair   UPPER GASTROINTESTINAL ENDOSCOPY  12/04/2020   also has had in past in [place], 2007-2008    Family History  Problem Relation Age of Onset   Hypertension Mother    Dementia Mother    Lung cancer Mother        [name] through treatment   Pneumonia Mother        covid complications    Hypertension Father    Dementia Father    Hypertension Brother    Colon cancer Neg Hx    Esophageal cancer Neg Hx    Pancreatic cancer Neg Hx    Stomach cancer Neg Hx    Liver disease Neg Hx    Colon polyps Neg Hx    Rectal cancer Neg Hx     Social History   Socioeconomic History   Marital status: Married    Spouse name: [name]    Number of children: 1   Years of education: Not on file   Highest education level:  12th grade  Occupational History   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  Tobacco Use   Smoking status: Never   Smokeless tobacco: Never  Vaping Use   Vaping Use: Never used  Substance and Sexual Activity   Alcohol use: No    Alcohol/week: 0.0 standard drinks of alcohol   Drug use: No   Sexual activity: Yes    Partners: Female  Other Topics Concern   Not on file  Social History Narrative   Not on file   Social Determinants of Health   Financial Resource Strain: Low Risk  (11/25/2020)   Overall Financial Resource Strain (CARDIA)    Difficulty of Paying Living Expenses: Not hard at all  Food Insecurity: No Food Insecurity (11/25/2020)   Hunger Vital Sign    Worried About Running Out of Food in the Last Year: Never true    Ran Out of Food in the Last Year: Never true  Transportation Needs: No Transportation Needs (11/25/2020)   PRAPARE - Administrator, Civil Service (Medical): No    Lack of Transportation (Non-Medical): No  Physical Activity: Sufficiently Active (11/25/2020)   Exercise Vital Sign    Days of Exercise per Week: 5 days    Minutes of Exercise per Session: 30 min  Stress: Stress Concern Present (11/25/2020)   [employer] of Occupational Health - Occupational Stress Questionnaire    Feeling of Stress : Rather much  Social Connections: Socially Integrated (11/25/2020)   Social Connection and Isolation Panel [NHANES]    Frequency of Communication with Friends and Family: More than three times a week    Frequency of Social Gatherings with Friends and Family: Three times a week    Attends Religious Services: More than 4 times per year    Active Member of Clubs or Organizations: Yes    Attends Banker Meetings: More than 4 times per year    Marital Status: Married  Catering manager Violence: Not At Risk (11/25/2020)   Humiliation, Afraid, Rape, and Kick questionnaire    Fear of Current or Ex-Partner: No    Emotionally Abused: No    Physically  Abused: No    Sexually Abused: No     Current Outpatient Medications:    ALPRAZolam (XANAX) 0.5 MG tablet, Take 1 tablet (0.5 mg total) by mouth 2 (two) times daily as needed for anxiety., Disp: 30 tablet, Rfl: 0   amLODipine-valsartan (EXFORGE) 5-160 MG tablet, Take 1 tablet by mouth daily. For blood pressure, Disp: 90 tablet, Rfl: 1   buPROPion (WELLBUTRIN XL) 150 MG 24 hr tablet, Take 1 tablet (150 mg total) by mouth every morning. For depression/energy, Disp: 90 tablet, Rfl: 1   colchicine 0.6 MG tablet, TAKE 1 TABLET BY MOUTH DAILY AS NEEDEDED.TAKE 2 AT ONSET OF GOUT AND REPEAT 1 TIME AN HOUR AS NEEDED, Disp: 30 tablet, Rfl: 0   hydrOXYzine (ATARAX) 25 MG tablet, Take 1 tablet (25 mg total) by mouth daily at 12 noon. For sleep/anxiety, Disp: 90 tablet, Rfl: 1   pantoprazole (PROTONIX) 40 MG tablet, Take 1 tablet (40 mg total) by mouth daily before supper. For heartburn, Disp: 90 tablet, Rfl: 1  No Known Allergies   ROS  ***   Objective  There were no vitals filed for this visit.  There is no height or weight on file to calculate BMI.  Physical Exam ***  No results found for this or any previous visit (from the past 2160 hour(s)).   Fall Risk:    09/26/2021    3:45 PM 07/14/2021    8:56  AM 05/29/2021   11:30 AM 02/24/2021    3:49 PM 12/13/2020    9:24 AM  Fall Risk   Falls in the past year? 1 0 0 1 0  Number falls in past yr: 0 0 0 1 0  Injury with Fall? 1 0 0 0 0  Risk for fall due to : No Fall Risks No Fall Risks  History of fall(s)   Follow up Falls prevention discussed Falls prevention discussed  Falls prevention discussed      Functional Status Survey:      Assessment & Plan  1. Well adult exam ***    -Prostate cancer screening and PSA options (with potential risks and benefits of testing vs not testing) were discussed along with recent recs/guidelines. -USPSTF grade A and B recommendations reviewed with patient; age-appropriate recommendations,  preventive care, screening tests, etc discussed and encouraged; healthy living encouraged; see AVS for patient education given to patient -Discussed importance of 150 minutes of physical activity weekly, eat two servings of fish weekly, eat one serving of tree nuts ( cashews, pistachios, pecans, almonds.Marland Kitchen) every other day, eat 6 servings of fruit/vegetables daily and drink plenty of water and avoid sweet beverages.  -Reviewed Health Maintenance: yes

## 2022-01-30 ENCOUNTER — Encounter: Payer: BC Managed Care – PPO | Admitting: Family Medicine

## 2022-01-30 DIAGNOSIS — Z0000 Encounter for general adult medical examination without abnormal findings: Secondary | ICD-10-CM

## 2022-04-06 NOTE — Progress Notes (Deleted)
Name: Johnathan Eaton   MRN: [id number]    DOB: 08-Oct-1970   Date:04/06/2022       Progress Note  Subjective  Chief Complaint  Follow up   HPI  HTN: on Exforge and bp is at goal. No chest pain or palpitation.  Continue medications   GERD: seen by Gastroenterologist 09/09/2020 with worsening of symptoms , taking PPI once a day to control symptoms, he had normal EGD and colonoscopy 11/2020. He avoids beef, pork, fried food and spicy food     GAD/Depression: he states he resumed  Citalopram when his mother died 31-Jul-2019 , but it  decreases his libido and makes him sleepy during the day therefore he is off medication again .He is back on Wellburin, hydroxizine once a day and alprazolam prn only for panic attacks and lasting at lest 4 months. Feeling well today    Controlled Gout: uric acid level improve when he stopped HCTZ. No problems since    Hyperglycemia: he denies polyphagia, polydipsia or polyuria, last hgbA1C was normal at 5.4 %, and we will recheck it during his CPE  Dyslipidemia: he is not ready for therapy at this time . We will recheck labs on his next visit   The 10-year ASCVD risk score (Arnett DK, et al., 2019) is: 9.9%   Values used to calculate the score:     Age: 51 years     Sex: Male     Is Non-Hispanic African American: Yes     Diabetic: No     Tobacco smoker: No     Systolic Blood Pressure: 761 mmHg     Is BP treated: Yes     HDL Cholesterol: 46 mg/dL     Total Cholesterol: 152 mg/dL   Patient Active Problem List   Diagnosis Date Noted   Acute right hip pain 07/14/2021   Gout 04/18/2017   Allergic rhinitis, seasonal 02/09/2015   GAD (generalized anxiety disorder) 02/09/2015   Benign hypertension 02/09/2015   Blood glucose elevated 02/09/2015   Gastric reflux 02/09/2015   Obesity (BMI 30-39.9) 02/09/2015    Past Surgical History:  Procedure Laterality Date   COLONOSCOPY  12/04/2020   SHOULDER SURGERY Left    Rotator Cuff Repair   UPPER  GASTROINTESTINAL ENDOSCOPY  12/04/2020   also has had in past in [place], 2007-2008    Family History  Problem Relation Age of Onset   Hypertension Mother    Dementia Mother    Lung cancer Mother        Martin Majestic through treatment   Pneumonia Mother        covid complications    Hypertension Father    Dementia Father    Hypertension Brother    Colon cancer Neg Hx    Esophageal cancer Neg Hx    Pancreatic cancer Neg Hx    Stomach cancer Neg Hx    Liver disease Neg Hx    Colon polyps Neg Hx    Rectal cancer Neg Hx     Social History   Tobacco Use   Smoking status: Never   Smokeless tobacco: Never  Substance Use Topics   Alcohol use: No    Alcohol/week: 0.0 standard drinks of alcohol     Current Outpatient Medications:    ALPRAZolam (XANAX) 0.5 MG tablet, Take 1 tablet (0.5 mg total) by mouth 2 (two) times daily as needed for anxiety., Disp: 30 tablet, Rfl: 0   amLODipine-valsartan (EXFORGE) 5-160 MG tablet, Take 1 tablet  by mouth daily. For blood pressure, Disp: 90 tablet, Rfl: 1   buPROPion (WELLBUTRIN XL) 150 MG 24 hr tablet, Take 1 tablet (150 mg total) by mouth every morning. For depression/energy, Disp: 90 tablet, Rfl: 1   colchicine 0.6 MG tablet, TAKE 1 TABLET BY MOUTH DAILY AS NEEDEDED.TAKE 2 AT ONSET OF GOUT AND REPEAT 1 TIME AN HOUR AS NEEDED, Disp: 30 tablet, Rfl: 0   hydrOXYzine (ATARAX) 25 MG tablet, Take 1 tablet (25 mg total) by mouth daily at 12 noon. For sleep/anxiety, Disp: 90 tablet, Rfl: 1   pantoprazole (PROTONIX) 40 MG tablet, Take 1 tablet (40 mg total) by mouth daily before supper. For heartburn, Disp: 90 tablet, Rfl: 1  No Known Allergies  I personally reviewed {Reviewed:14835} with the patient/caregiver today.   ROS  ***  Objective  There were no vitals filed for this visit.  There is no height or weight on file to calculate BMI.  Physical Exam ***  No results found for this or any previous visit (from the past 2160  hour(s)).  Diabetic Foot Exam: Diabetic Foot Exam - Simple   No data filed    ***  PHQ2/9:    09/26/2021    3:45 PM 07/14/2021    8:56 AM 05/29/2021   11:30 AM 02/24/2021    3:49 PM 12/13/2020    9:27 AM  Depression screen PHQ 2/9  Decreased Interest 0 0 0 1 0  Down, Depressed, Hopeless 0 0 0 0 0  PHQ - 2 Score 0 0 0 1 0  Altered sleeping 0 0 2 1 0  Tired, decreased energy 0 0 2 1 2   Change in appetite 0 0 0 1 0  Feeling bad or failure about yourself  0 0 0 0 0  Trouble concentrating 0 0 0 0 0  Moving slowly or fidgety/restless 0 0 0 0 0  Suicidal thoughts 0 0 0 1 0  PHQ-9 Score 0 0 4 5 2   Difficult doing work/chores  Not difficult at all  Somewhat difficult     phq 9 is {gen pos ***  Fall Risk:    09/26/2021    3:45 PM 07/14/2021    8:56 AM 05/29/2021   11:30 AM 02/24/2021    3:49 PM 12/13/2020    9:24 AM  Fall Risk   Falls in the past year? 1 0 0 1 0  Number falls in past yr: 0 0 0 1 0  Injury with Fall? 1 0 0 0 0  Risk for fall due to : No Fall Risks No Fall Risks  History of fall(s)   Follow up Falls prevention discussed Falls prevention discussed  Falls prevention discussed    ***   Functional Status Survey:   ***   Assessment & Plan  *** There are no diagnoses linked to this encounter.

## 2022-04-07 ENCOUNTER — Ambulatory Visit: Payer: BC Managed Care – PPO | Admitting: Family Medicine

## 2022-04-13 NOTE — Progress Notes (Unsigned)
Name: Johnathan Eaton   MRN: [id number]    DOB: 07-30-1970   Date:04/14/2022       Progress Note  Subjective  Chief Complaint  Follow Up  HPI  HTN: on Exforge and bp is at goal. No chest pain , dizziness or palpitation.  Continue medications   GERD: seen by Gastroenterologist 09/09/2020 with worsening of symptoms , taking PPI once a day to control symptoms, he had normal EGD and colonoscopy 11/2020. Symptoms are stable   GAD/Depression: he states he resumed  Citalopram when his mother died 07-10-19, but it  decreases his libido and makes him sleepy during the day therefore he is off medication again He is now on Wellbutrin, he stopped hydroxizine and is doing well on prn alprazolam for panic attacks that happens seldom, 30 pills lasts him about 6 months    Controlled Gout: uric acid level improve when he stopped HCTZ. No problems since . Unchanged    Hyperglycemia: he denies polyphagia, polydipsia or polyuria, last hgbA1C was normal at 5.4 %  Dyslipidemia: he is not ready for therapy at this time . We will recheck labs today   The 10-year ASCVD risk score (Arnett DK, et al., 2019) is: 8.6%   Values used to calculate the score:     Age: 51 years     Sex: Male     Is Non-Hispanic African American: Yes     Diabetic: No     Tobacco smoker: No     Systolic Blood Pressure: 124 mmHg     Is BP treated: Yes     HDL Cholesterol: 46 mg/dL     Total Cholesterol: 152 mg/dL   Patient Active Problem List   Diagnosis Date Noted   Acute right hip pain 07/14/2021   Gout 04/18/2017   Allergic rhinitis, seasonal 02/09/2015   GAD (generalized anxiety disorder) 02/09/2015   Benign hypertension 02/09/2015   Blood glucose elevated 02/09/2015   Gastric reflux 02/09/2015   Obesity (BMI 30-39.9) 02/09/2015    Past Surgical History:  Procedure Laterality Date   COLONOSCOPY  12/04/2020   SHOULDER SURGERY Left    Rotator Cuff Repair   UPPER GASTROINTESTINAL ENDOSCOPY  12/04/2020   also has  had in past in [place], 2007[place]2008    Family History  Problem Relation Age of Onset   Hypertension Mother    Dementia Mother    Lung cancer Mother        [name] through treatment   Pneumonia Mother        covid complications    Hypertension Father    Dementia Father    Hypertension Brother    Colon cancer Neg Hx    Esophageal cancer Neg Hx    Pancreatic cancer Neg Hx    Stomach cancer Neg Hx    Liver disease Neg Hx    Colon polyps Neg Hx    Rectal cancer Neg Hx     Social History   Tobacco Use   Smoking status: Never   Smokeless tobacco: Never  Substance Use Topics   Alcohol use: No    Alcohol/week: 0.0 standard drinks of alcohol     Current Outpatient Medications:    ALPRAZolam (XANAX) 0.5 MG tablet, Take 1 tablet (0.5 mg total) by mouth 2 (two) times daily as needed for anxiety., Disp: 30 tablet, Rfl: 0   amLODipine-valsartan (EXFORGE) 5-160 MG tablet, Take 1 tablet by mouth daily. For blood pressure, Disp: 90 tablet, Rfl: 1  buPROPion (WELLBUTRIN XL) 150 MG 24 hr tablet, Take 1 tablet (150 mg total) by mouth every morning. For depression/energy, Disp: 90 tablet, Rfl: 1   colchicine 0.6 MG tablet, TAKE 1 TABLET BY MOUTH DAILY AS NEEDEDED.TAKE 2 AT ONSET OF GOUT AND REPEAT 1 TIME AN HOUR AS NEEDED, Disp: 30 tablet, Rfl: 0   hydrOXYzine (ATARAX) 25 MG tablet, Take 1 tablet (25 mg total) by mouth daily at 12 noon. For sleep/anxiety, Disp: 90 tablet, Rfl: 1   pantoprazole (PROTONIX) 40 MG tablet, Take 1 tablet (40 mg total) by mouth daily before supper. For heartburn, Disp: 90 tablet, Rfl: 1  No Known Allergies  I personally reviewed active problem list, medication list, allergies, family history, social history with the patient/caregiver today.   ROS  Constitutional: Negative for fever or weight change.  Respiratory: Negative for cough and shortness of breath.   Cardiovascular: Negative for chest pain or palpitations.  Gastrointestinal: Negative for abdominal  pain, no bowel changes.  Musculoskeletal: Negative for gait problem or joint swelling.  Skin: Negative for rash.  Neurological: Negative for dizziness or headache.  No other specific complaints in a complete review of systems (except as listed in HPI above).   Objective  Vitals:   04/14/22 0855  BP: 124/82  Pulse: 87  Resp: 16  SpO2: 98%  Weight: 215 lb (97.5 kg)  Height: 5\' 9"  (1.753 m)    Body mass index is 31.75 kg/m.  Physical Exam  Constitutional: Patient appears well-developed and well-nourished. Obese  No distress.  HEENT: head atraumatic, normocephalic, pupils equal and reactive to light, neck supple Cardiovascular: Normal rate, regular rhythm and normal heart sounds.  No murmur heard. No BLE edema. Pulmonary/Chest: Effort normal and breath sounds normal. No respiratory distress. Abdominal: Soft.  There is no tenderness. Psychiatric: Patient has a normal mood and affect. behavior is normal. Judgment and thought content normal.    PHQ2/9:    04/14/2022    8:55 AM 09/26/2021    3:45 PM 07/14/2021    8:56 AM 05/29/2021   11:30 AM 02/24/2021    3:49 PM  Depression screen PHQ 2/9  Decreased Interest 0 0 0 0 1  Down, Depressed, Hopeless 0 0 0 0 0  PHQ - 2 Score 0 0 0 0 1  Altered sleeping 0 0 0 2 1  Tired, decreased energy 0 0 0 2 1  Change in appetite 0 0 0 0 1  Feeling bad or failure about yourself  0 0 0 0 0  Trouble concentrating 0 0 0 0 0  Moving slowly or fidgety/restless 0 0 0 0 0  Suicidal thoughts 0 0 0 0 1  PHQ-9 Score 0 0 0 4 5  Difficult doing work/chores   Not difficult at all  Somewhat difficult    phq 9 is negative   Fall Risk:    04/14/2022    8:55 AM 09/26/2021    3:45 PM 07/14/2021    8:56 AM 05/29/2021   11:30 AM 02/24/2021    3:49 PM  Fall Risk   Falls in the past year? 1 1 0 0 1  Number falls in past yr: 0 0 0 0 1  Injury with Fall? 0 1 0 0 0  Risk for fall due to : No Fall Risks No Fall Risks No Fall Risks  History of fall(s)   Follow up Falls prevention discussed Falls prevention discussed Falls prevention discussed  Falls prevention discussed      Functional  Status Survey: Is the patient deaf or have difficulty hearing?: No Does the patient have difficulty seeing, even when wearing glasses/contacts?: Yes Does the patient have difficulty concentrating, remembering, or making decisions?: No Does the patient have difficulty walking or climbing stairs?: No Does the patient have difficulty dressing or bathing?: No Does the patient have difficulty doing errands alone such as visiting a doctor's office or shopping?: No    Assessment & Plan  1. Dysthymia  - buPROPion (WELLBUTRIN XL) 150 MG 24 hr tablet; Take 1 tablet (150 mg total) by mouth every morning. For depression/energy  Dispense: 90 tablet; Refill: 1  2. GAD (generalized anxiety disorder)  - buPROPion (WELLBUTRIN XL) 150 MG 24 hr tablet; Take 1 tablet (150 mg total) by mouth every morning. For depression/energy  Dispense: 90 tablet; Refill: 1 - ALPRAZolam (XANAX) 0.5 MG tablet; Take 1 tablet (0.5 mg total) by mouth 2 (two) times daily as needed for anxiety.  Dispense: 30 tablet; Refill: 0  3. Benign hypertension  - amLODipine-valsartan (EXFORGE) 5-160 MG tablet; Take 1 tablet by mouth daily. For blood pressure  Dispense: 90 tablet; Refill: 1 - COMPLETE METABOLIC PANEL WITH GFR - CBC with Differential/Platelet  4. Panic attack  - ALPRAZolam (XANAX) 0.5 MG tablet; Take 1 tablet (0.5 mg total) by mouth 2 (two) times daily as needed for anxiety.  Dispense: 30 tablet; Refill: 0  5. GERD without esophagitis  - pantoprazole (PROTONIX) 40 MG tablet; Take 1 tablet (40 mg total) by mouth daily before supper. For heartburn  Dispense: 90 tablet; Refill: 1  6. Dyslipidemia  - Lipid panel  7. Hyperglycemia  - Hemoglobin A1c  8. Controlled gout  - Uric acid

## 2022-04-14 ENCOUNTER — Ambulatory Visit (INDEPENDENT_AMBULATORY_CARE_PROVIDER_SITE_OTHER): Payer: BC Managed Care – PPO | Admitting: Family Medicine

## 2022-04-14 ENCOUNTER — Encounter: Payer: Self-pay | Admitting: Family Medicine

## 2022-04-14 VITALS — BP 124/82 | HR 87 | Resp 16 | Ht 69.0 in | Wt 215.0 lb

## 2022-04-14 DIAGNOSIS — K219 Gastro-esophageal reflux disease without esophagitis: Secondary | ICD-10-CM

## 2022-04-14 DIAGNOSIS — E785 Hyperlipidemia, unspecified: Secondary | ICD-10-CM | POA: Diagnosis not present

## 2022-04-14 DIAGNOSIS — F411 Generalized anxiety disorder: Secondary | ICD-10-CM | POA: Diagnosis not present

## 2022-04-14 DIAGNOSIS — R739 Hyperglycemia, unspecified: Secondary | ICD-10-CM

## 2022-04-14 DIAGNOSIS — F410 Panic disorder [episodic paroxysmal anxiety] without agoraphobia: Secondary | ICD-10-CM

## 2022-04-14 DIAGNOSIS — M109 Gout, unspecified: Secondary | ICD-10-CM | POA: Diagnosis not present

## 2022-04-14 DIAGNOSIS — F341 Dysthymic disorder: Secondary | ICD-10-CM

## 2022-04-14 DIAGNOSIS — I10 Essential (primary) hypertension: Secondary | ICD-10-CM

## 2022-04-14 MED ORDER — BUPROPION HCL ER (XL) 150 MG PO TB24: 150.0000 mg | ORAL_TABLET | ORAL | 1 refills | Status: DC

## 2022-04-14 MED ORDER — ALPRAZOLAM 0.5 MG PO TABS: 0.5000 mg | ORAL_TABLET | Freq: Two times a day (BID) | ORAL | 0 refills | Status: DC | PRN

## 2022-04-14 MED ORDER — AMLODIPINE BESYLATE-VALSARTAN 5-160 MG PO TABS: 1.0000 | ORAL_TABLET | Freq: Every day | ORAL | 1 refills | Status: DC

## 2022-04-14 MED ORDER — PANTOPRAZOLE SODIUM 40 MG PO TBEC: 40.0000 mg | DELAYED_RELEASE_TABLET | Freq: Every day | ORAL | 1 refills | Status: DC

## 2022-04-15 LAB — CBC WITH DIFFERENTIAL/PLATELET
Absolute Monocytes: 380 cells/uL (ref 200–950)
Basophils Absolute: 52 cells/uL (ref 0–200)
Basophils Relative: 1 %
Eosinophils Absolute: 348 cells/uL (ref 15–500)
Eosinophils Relative: 6.7 %
HCT: 46.8 % (ref 38.5–50.0)
Hemoglobin: 15 g/dL (ref 13.2–17.1)
Lymphs Abs: 1565 cells/uL (ref 850–3900)
MCH: 25 pg — ABNORMAL LOW (ref 27.0–33.0)
MCHC: 32.1 g/dL (ref 32.0–36.0)
MCV: 77.9 fL — ABNORMAL LOW (ref 80.0–100.0)
MPV: 10.2 fL (ref 7.5–12.5)
Monocytes Relative: 7.3 %
Neutro Abs: 2855 cells/uL (ref 1500–7800)
Neutrophils Relative %: 54.9 %
Platelets: 295 10*3/uL (ref 140–400)
RBC: 6.01 10*6/uL — ABNORMAL HIGH (ref 4.20–5.80)
RDW: 14.1 % (ref 11.0–15.0)
Total Lymphocyte: 30.1 %
WBC: 5.2 10*3/uL (ref 3.8–10.8)

## 2022-04-15 LAB — COMPLETE METABOLIC PANEL WITH GFR
AG Ratio: 1.4 (calc) (ref 1.0–2.5)
ALT: 17 U/L (ref 9–46)
AST: 20 U/L (ref 10–35)
Albumin: 4.3 g/dL (ref 3.6–5.1)
Alkaline phosphatase (APISO): 98 U/L (ref 35–144)
BUN: 11 mg/dL (ref 7–25)
CO2: 30 mmol/L (ref 20–32)
Calcium: 9.6 mg/dL (ref 8.6–10.3)
Chloride: 101 mmol/L (ref 98–110)
Creat: 1.09 mg/dL (ref 0.70–1.30)
Globulin: 3 g/dL (calc) (ref 1.9–3.7)
Glucose, Bld: 92 mg/dL (ref 65–99)
Potassium: 4.4 mmol/L (ref 3.5–5.3)
Sodium: 138 mmol/L (ref 135–146)
Total Bilirubin: 0.8 mg/dL (ref 0.2–1.2)
Total Protein: 7.3 g/dL (ref 6.1–8.1)
eGFR: 82 mL/min/{1.73_m2} (ref >=60)

## 2022-04-15 LAB — HEMOGLOBIN A1C
Hgb A1c MFr Bld: 5.3 % of total Hgb (ref <5.7)
Mean Plasma Glucose: 105 mg/dL
eAG (mmol/L): 5.8 mmol/L

## 2022-04-15 LAB — LIPID PANEL
Cholesterol: 146 mg/dL (ref <200)
HDL: 44 mg/dL (ref >=40)
LDL Cholesterol (Calc): 86 mg/dL (calc)
Non-HDL Cholesterol (Calc): 102 mg/dL (calc) (ref <130)
Total CHOL/HDL Ratio: 3.3 (calc) (ref <5.0)
Triglycerides: 72 mg/dL (ref <150)

## 2022-04-15 LAB — URIC ACID: Uric Acid, Serum: 6.9 mg/dL (ref 4.0–8.0)

## 2022-07-11 ENCOUNTER — Other Ambulatory Visit: Payer: Self-pay | Admitting: Family Medicine

## 2022-07-11 DIAGNOSIS — F411 Generalized anxiety disorder: Secondary | ICD-10-CM

## 2022-10-13 ENCOUNTER — Ambulatory Visit: Payer: BC Managed Care – PPO | Admitting: Internal Medicine

## 2022-12-14 ENCOUNTER — Emergency Department
Admission: EM | Admit: 2022-12-14 | Discharge: 2022-12-14 | Disposition: A | Discharge status: Home / Self Care | Payer: BC Managed Care – PPO | Attending: Emergency Medicine | Admitting: Emergency Medicine

## 2022-12-14 ENCOUNTER — Emergency Department: Payer: BC Managed Care – PPO

## 2022-12-14 ENCOUNTER — Ambulatory Visit: Payer: BC Managed Care – PPO | Admitting: Family Medicine

## 2022-12-14 ENCOUNTER — Other Ambulatory Visit: Payer: Self-pay

## 2022-12-14 DIAGNOSIS — R42 Dizziness and giddiness: Secondary | ICD-10-CM | POA: Insufficient documentation

## 2022-12-14 DIAGNOSIS — R079 Chest pain, unspecified: Secondary | ICD-10-CM | POA: Diagnosis not present

## 2022-12-14 DIAGNOSIS — R0789 Other chest pain: Secondary | ICD-10-CM | POA: Insufficient documentation

## 2022-12-14 DIAGNOSIS — R209 Unspecified disturbances of skin sensation: Secondary | ICD-10-CM | POA: Diagnosis not present

## 2022-12-14 LAB — BASIC METABOLIC PANEL
Anion gap: 8 (ref 5–15)
BUN: 15 mg/dL (ref 6–20)
CO2: 26 mmol/L (ref 22–32)
Calcium: 9.2 mg/dL (ref 8.9–10.3)
Chloride: 103 mmol/L (ref 98–111)
Creatinine, Ser: 1.17 mg/dL (ref 0.61–1.24)
GFR, Estimated: >60 mL/min (ref >60)
Glucose, Bld: 106 mg/dL — ABNORMAL HIGH (ref 70–99)
Potassium: 3.6 mmol/L (ref 3.5–5.1)
Sodium: 137 mmol/L (ref 135–145)

## 2022-12-14 LAB — CBC
HCT: 48.6 % (ref 39.0–52.0)
Hemoglobin: 15.8 g/dL (ref 13.0–17.0)
MCH: 24.8 pg — ABNORMAL LOW (ref 26.0–34.0)
MCHC: 32.5 g/dL (ref 30.0–36.0)
MCV: 76.2 fL — ABNORMAL LOW (ref 80.0–100.0)
Platelets: 303 10*3/uL (ref 150–400)
RBC: 6.38 MIL/uL — ABNORMAL HIGH (ref 4.22–5.81)
RDW: 14.8 % (ref 11.5–15.5)
WBC: 7.6 10*3/uL (ref 4.0–10.5)
nRBC: 0 % (ref 0.0–0.2)

## 2022-12-14 LAB — TROPONIN I (HIGH SENSITIVITY): Troponin I (High Sensitivity): 3 ng/L (ref <18)

## 2022-12-14 MED ORDER — SODIUM CHLORIDE 0.9 % IV SOLN: Freq: Once | INTRAVENOUS | Status: AC

## 2022-12-14 NOTE — Progress Notes (Deleted)
Name: VERLIN UHER   MRN: [id number]    DOB: 1970-11-30   Date:12/14/2022       Progress Note  Subjective  Chief Complaint  Left Side Discomfort  HPI  *** Patient Active Problem List   Diagnosis Date Noted   Gout 04/18/2017   Allergic rhinitis, seasonal 02/09/2015   GAD (generalized anxiety disorder) 02/09/2015   Benign hypertension 02/09/2015   Blood glucose elevated 02/09/2015   Gastric reflux 02/09/2015   Obesity (BMI 30-39.9) 02/09/2015    Past Surgical History:  Procedure Laterality Date   COLONOSCOPY  12/04/2020   SHOULDER SURGERY Left    Rotator Cuff Repair   UPPER GASTROINTESTINAL ENDOSCOPY  12/04/2020   also has had in past in [place], 2007-2008    Family History  Problem Relation Age of Onset   Hypertension Mother    Dementia Mother    Lung cancer Mother        [name] through treatment   Pneumonia Mother        covid complications    Hypertension Father    Dementia Father    Hypertension Brother    Colon cancer Neg Hx    Esophageal cancer Neg Hx    Pancreatic cancer Neg Hx    Stomach cancer Neg Hx    Liver disease Neg Hx    Colon polyps Neg Hx    Rectal cancer Neg Hx     Social History   Tobacco Use   Smoking status: Never   Smokeless tobacco: Never  Substance Use Topics   Alcohol use: No    Alcohol/week: 0.0 standard drinks of alcohol     Current Outpatient Medications:    ALPRAZolam (XANAX) 0.5 MG tablet, Take 1 tablet (0.5 mg total) by mouth 2 (two) times daily as needed for anxiety., Disp: 30 tablet, Rfl: 0   amLODipine-valsartan (EXFORGE) 5-160 MG tablet, Take 1 tablet by mouth daily. For blood pressure, Disp: 90 tablet, Rfl: 1   buPROPion (WELLBUTRIN XL) 150 MG 24 hr tablet, Take 1 tablet (150 mg total) by mouth every morning. For depression/energy, Disp: 90 tablet, Rfl: 1   colchicine 0.6 MG tablet, TAKE 1 TABLET BY MOUTH DAILY AS NEEDEDED.TAKE 2 AT ONSET OF GOUT AND REPEAT 1 TIME AN HOUR AS NEEDED, Disp: 30 tablet, Rfl: 0    pantoprazole (PROTONIX) 40 MG tablet, Take 1 tablet (40 mg total) by mouth daily before supper. For heartburn, Disp: 90 tablet, Rfl: 1  No Known Allergies  I personally reviewed active problem list, medication list, allergies, family history, social history, health maintenance with the patient/caregiver today.   ROS  ***  Objective  There were no vitals filed for this visit.  There is no height or weight on file to calculate BMI.  Physical Exam ***  No results found for this or any previous visit (from the past 2160 hour(s)).   PHQ2/9:    04/14/2022    8:55 AM 09/26/2021    3:45 PM 07/14/2021    8:56 AM 05/29/2021   11:30 AM 02/24/2021    3:49 PM  Depression screen PHQ 2/9  Decreased Interest 0 0 0 0 1  Down, Depressed, Hopeless 0 0 0 0 0  PHQ - 2 Score 0 0 0 0 1  Altered sleeping 0 0 0 2 1  Tired, decreased energy 0 0 0 2 1  Change in appetite 0 0 0 0 1  Feeling bad or failure about yourself  0 0 0 0 0  Trouble concentrating 0 0 0 0 0  Moving slowly or fidgety/restless 0 0 0 0 0  Suicidal thoughts 0 0 0 0 1  PHQ-9 Score 0 0 0 4 5  Difficult doing work/chores   Not difficult at all  Somewhat difficult    phq 9 is {gen pos [id number]}   Fall Risk:    04/14/2022    8:55 AM 09/26/2021    3:45 PM 07/14/2021    8:56 AM 05/29/2021   11:30 AM 02/24/2021    3:49 PM  Fall Risk   Falls in the past year? 1 1 0 0 1  Number falls in past yr: 0 0 0 0 1  Injury with Fall? 0 1 0 0 0  Risk for fall due to : No Fall Risks No Fall Risks No Fall Risks  History of fall(s)  Follow up Falls prevention discussed Falls prevention discussed Falls prevention discussed  Falls prevention discussed      Functional Status Survey:      Assessment & Plan  *** There are no diagnoses linked to this encounter.

## 2022-12-14 NOTE — ED Triage Notes (Signed)
Pt sts that he has been having lateral left sided chest pain since Saturday.

## 2022-12-14 NOTE — ED Provider Notes (Signed)
[facility] Provider Note    Event Date/Time   First MD Initiated Contact with Patient 12/14/22 1310     (approximate)   History   Chest Pain   HPI  Johnathan Eaton is a 52 y.o. male who presents with complaints of left lateral chest discomfort which has been ongoing for couple of days, a sensation of numbness and tingling over his body, occasional lightheadedness.  He reports he had similar symptoms couple of years ago that lasted about 3 months and resolved on their own.  No upper or lower extremity weakness.  No nausea vomiting diaphoresis.  No cough or fevers     Physical Exam   Triage Vital Signs: ED Triage Vitals  Encounter Vitals Group     BP 12/14/22 1202 (!) 174/120     Systolic BP Percentile --      Diastolic BP Percentile --      Pulse Rate 12/14/22 1200 (!) 112     Resp 12/14/22 1200 17     Temp 12/14/22 1200 98.3 F (36.8 C)     Temp Source 12/14/22 1200 Oral     SpO2 12/14/22 1200 99 %     Weight 12/14/22 1200 95.3 kg (210 lb)     Height 12/14/22 1200 1.753 m (5\' 9" )     Head Circumference --      Peak Flow --      Pain Score 12/14/22 1200 2     Pain Loc --      Pain Education --      Exclude from Growth Chart --     Most recent vital signs: Vitals:   12/14/22 1202 12/14/22 1435  BP: (!) 174/120 (!) 141/86  Pulse:  88  Resp:  17  Temp:    SpO2:  99%     General: Awake, no distress.  CV:  Good peripheral perfusion.  No chest wall tenderness palpation, no rash, no lymphadenopathy resp:  Normal effort.  Abd:  No distention.  Other:     ED Results / Procedures / Treatments   Labs (all labs ordered are listed, but only abnormal results are displayed) Labs Reviewed  BASIC METABOLIC PANEL - Abnormal; Notable for the following components:      Result Value   Glucose, Bld 106 (*)    All other components within normal limits  CBC - Abnormal; Notable for the following components:   RBC 6.38 (*)    MCV 76.2 (*)     MCH 24.8 (*)    All other components within normal limits  TROPONIN I (HIGH SENSITIVITY)     EKG  ED ECG REPORT I, Jene Every, the attending physician, personally viewed and interpreted this ECG.  Date: 12/14/2022  Rhythm: normal sinus rhythm QRS Axis: normal Intervals: normal ST/T Wave abnormalities: normal Narrative Interpretation: no evidence of acute ischemia    RADIOLOGY Chest x-ray viewed interpret by me, no acute abnormality    PROCEDURES:  Critical Care performed:   Procedures   MEDICATIONS ORDERED IN ED: Medications  0.9 %  sodium chloride infusion (0 mLs Intravenous Stopped 12/14/22 1441)     IMPRESSION / MDM / ASSESSMENT AND PLAN / ED COURSE  I reviewed the triage vital signs and the nursing notes. Patient's presentation is most consistent with acute illness / injury with system symptoms.  Patient presents with complaints of left-sided chest discomfort, more in the left axilla really than in the anterior chest.  He reports this occurred  several years ago and was present for several months and then just went away on its own.  Denies pleurisy, no shortness of breath, no hemoptysis, no tachycardia, not consistent with PE.  No palpable lymphadenopathy  No rash.  Chest x-ray is unremarkable, no pneumonia or pneumothorax  Additionally he reports occasionally feeling lightheaded and having tingling in his body.  EKG, high sensitive troponin is normal, electrolytes are normal, blood counts are normal.  With IV fluid and reports that it did help him feel better  There appears to be a component of anxiety to this, family appears somewhat frustrated that we are unable to solve his complaints today however explained that we are placing cardiology follow-up for thorough evaluation and no indication for admission at this time.  Recommend discussing with PCP regarding changing anxiety medications.      FINAL CLINICAL IMPRESSION(S) / ED DIAGNOSES   Final  diagnoses:  Atypical chest pain     Rx / DC Orders   ED Discharge Orders          Ordered    Ambulatory referral to Cardiology       Comments: If you have not heard from the Cardiology office within the next 72 hours please call [phone].   12/14/22 1506             Note:  This document was prepared using Dragon voice recognition software and may include unintentional dictation errors.   Jene Every, MD 12/14/22 1536

## 2022-12-14 NOTE — ED Notes (Signed)
Pt/wife state that he does not feel comfortable with how he is feeling despite reassuring bloodwork and scans and that they would like information to follow up with a cardiologist and to speak with EDP before discharge. EDP aware, education regarding plan of care given.

## 2022-12-16 ENCOUNTER — Encounter: Payer: Self-pay | Admitting: Family Medicine

## 2022-12-16 ENCOUNTER — Encounter: Payer: BC Managed Care – PPO | Admitting: Family Medicine

## 2022-12-16 NOTE — Progress Notes (Signed)
Name: Johnathan Eaton   MRN: [id number]    DOB: Mar 15, 1971   Date:12/16/2022       Progress Note  Subjective  Chief Complaint  Chief Complaint  Patient presents with   Follow-up    I connected with  Johnathan Eaton  on 12/16/22 at 11:00 AM EDT by a video enabled telemedicine application and verified that I am speaking with the correct person using two identifiers.  I discussed the limitations of evaluation and management by telemedicine and the availability of in person appointments. The patient expressed understanding and agreed to proceed with the virtual visit  Staff also discussed with the patient that there may be a patient responsible charge related to this service. Patient Location: *** Provider Location: *** Additional Individuals present: ***  HPI  *** Patient Active Problem List   Diagnosis Date Noted   Gout 04/18/2017   Allergic rhinitis, seasonal 02/09/2015   GAD (generalized anxiety disorder) 02/09/2015   Benign hypertension 02/09/2015   Blood glucose elevated 02/09/2015   Gastric reflux 02/09/2015   Obesity (BMI 30-39.9) 02/09/2015    Past Surgical History:  Procedure Laterality Date   COLONOSCOPY  12/04/2020   SHOULDER SURGERY Left    Rotator Cuff Repair   UPPER GASTROINTESTINAL ENDOSCOPY  12/04/2020   also has had in past in [place], 2007-2008    Family History  Problem Relation Age of Onset   Hypertension Mother    Dementia Mother    Lung cancer Mother        [name] through treatment   Pneumonia Mother        covid complications    Hypertension Father    Dementia Father    Hypertension Brother    Colon cancer Neg Hx    Esophageal cancer Neg Hx    Pancreatic cancer Neg Hx    Stomach cancer Neg Hx    Liver disease Neg Hx    Colon polyps Neg Hx    Rectal cancer Neg Hx     Social History   Socioeconomic History   Marital status: Married    Spouse name: [name]    Number of children: 1   Years of education: Not on file   Highest  education level: 12th grade  Occupational History   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   Tobacco Use   Smoking status: Never   Smokeless tobacco: Never  Vaping Use   Vaping status: Never Used  Substance and Sexual Activity   Alcohol use: No    Alcohol/week: 0.0 standard drinks of alcohol   Drug use: No   Sexual activity: Yes    Partners: Female  Other Topics Concern   Not on file  Social History Narrative   Not on file   Social Determinants of Health   Financial Resource Strain: Low Risk  (11/25/2020)   Overall Financial Resource Strain (CARDIA)    Difficulty of Paying Living Expenses: Not hard at all  Food Insecurity: No Food Insecurity (11/25/2020)   Hunger Vital Sign    Worried About Running Out of Food in the Last Year: Never true    Ran Out of Food in the Last Year: Never true  Transportation Needs: No Transportation Needs (11/25/2020)   PRAPARE - Administrator, Civil Service (Medical): No    Lack of Transportation (Non-Medical): No  Physical Activity: Sufficiently Active (11/25/2020)   Exercise Vital Sign    Days of Exercise per Week: 5 days    Minutes of Exercise  per Session: 30 min  Stress: Stress Concern Present (11/25/2020)   [employer] of Occupational Health - Occupational Stress Questionnaire    Feeling of Stress : Rather much  Social Connections: Socially Integrated (11/25/2020)   Social Connection and Isolation Panel [NHANES]    Frequency of Communication with Friends and Family: More than three times a week    Frequency of Social Gatherings with Friends and Family: Three times a week    Attends Religious Services: More than 4 times per year    Active Member of Clubs or Organizations: Yes    Attends Banker Meetings: More than 4 times per year    Marital Status: Married  Catering manager Violence: Not At Risk (11/25/2020)   Humiliation, Afraid, Rape, and Kick questionnaire    Fear of Current or Ex-Partner: No    Emotionally Abused:  No    Physically Abused: No    Sexually Abused: No     Current Outpatient Medications:    ALPRAZolam (XANAX) 0.5 MG tablet, Take 1 tablet (0.5 mg total) by mouth 2 (two) times daily as needed for anxiety., Disp: 30 tablet, Rfl: 0   amLODipine-valsartan (EXFORGE) 5-160 MG tablet, Take 1 tablet by mouth daily. For blood pressure, Disp: 90 tablet, Rfl: 1   buPROPion (WELLBUTRIN XL) 150 MG 24 hr tablet, Take 1 tablet (150 mg total) by mouth every morning. For depression/energy, Disp: 90 tablet, Rfl: 1   colchicine 0.6 MG tablet, TAKE 1 TABLET BY MOUTH DAILY AS NEEDEDED.TAKE 2 AT ONSET OF GOUT AND REPEAT 1 TIME AN HOUR AS NEEDED, Disp: 30 tablet, Rfl: 0   pantoprazole (PROTONIX) 40 MG tablet, Take 1 tablet (40 mg total) by mouth daily before supper. For heartburn, Disp: 90 tablet, Rfl: 1  No Known Allergies  I personally reviewed active problem list, medication list, allergies, family history, social history, health maintenance with the patient/caregiver today.   ROS ***  Objective  Virtual encounter, vitals not obtained.  There is no height or weight on file to calculate BMI.  Physical Exam ***  Results for orders placed or performed during the hospital encounter of 12/14/22 (from the past 72 hour(s))  Basic metabolic panel     Status: Abnormal   Collection Time: 12/14/22 11:58 AM  Result Value Ref Range   Sodium 137 135 - 145 mmol/L   Potassium 3.6 3.5 - 5.1 mmol/L   Chloride 103 98 - 111 mmol/L   CO2 26 22 - 32 mmol/L   Glucose, Bld 106 (H) 70 - 99 mg/dL    Comment: Glucose reference range applies only to samples taken after fasting for at least 8 hours.   BUN 15 6 - 20 mg/dL   Creatinine, Ser 4.09 0.61 - 1.24 mg/dL   Calcium 9.2 8.9 - 81.1 mg/dL   GFR, Estimated >91 >47 mL/min    Comment: (NOTE) Calculated using the CKD-EPI Creatinine Equation (2021)    Anion gap 8 5 - 15    Comment: Performed at [facility], [address] [place], [place] [address]  CBC      Status: Abnormal   Collection Time: 12/14/22 11:58 AM  Result Value Ref Range   WBC 7.6 4.0 - 10.5 K/uL   RBC 6.38 (H) 4.22 - 5.81 MIL/uL   Hemoglobin 15.8 13.0 - 17.0 g/dL   HCT 21.3 08.6 - 57.8 %   MCV 76.2 (L) 80.0 - 100.0 fL   MCH 24.8 (L) 26.0 - 34.0 pg   MCHC 32.5  30.0 - 36.0 g/dL   RDW 66.0 63.0 - 16.0 %   Platelets 303 150 - 400 K/uL   nRBC 0.0 0.0 - 0.2 %    Comment: Performed at [facility], [address] [place], [place] [address]  Troponin I (High Sensitivity)     Status: None   Collection Time: 12/14/22 11:58 AM  Result Value Ref Range   Troponin I (High Sensitivity) 3 <18 ng/L    Comment: (NOTE) Elevated high sensitivity troponin I (hsTnI) values and significant  changes across serial measurements may suggest ACS but many other  chronic and acute conditions are known to elevate hsTnI results.  Refer to the "Links" section for chest pain algorithms and additional  guidance. Performed at [facility], [address] [place], [place] [address]     PHQ2/9:    12/16/2022   10:19 AM 04/14/2022    8:55 AM 09/26/2021    3:45 PM 07/14/2021    8:56 AM 05/29/2021   11:30 AM  Depression screen PHQ 2/9  Decreased Interest 1 0 0 0 0  Down, Depressed, Hopeless 1 0 0 0 0  PHQ - 2 Score 2 0 0 0 0  Altered sleeping 1 0 0 0 2  Tired, decreased energy 1 0 0 0 2  Change in appetite 0 0 0 0 0  Feeling bad or failure about yourself  1 0 0 0 0  Trouble concentrating 0 0 0 0 0  Moving slowly or fidgety/restless 0 0 0 0 0  Suicidal thoughts 0 0 0 0 0  PHQ-9 Score 5 0 0 0 4  Difficult doing work/chores Somewhat difficult   Not difficult at all    PHQ-2/9 Result is {gen negative/positive:315881}.    Fall Risk:    12/16/2022   10:18 AM 04/14/2022    8:55 AM 09/26/2021    3:45 PM 07/14/2021    8:56 AM 05/29/2021   11:30 AM  Fall Risk   Falls in the past year? 0 1 1 0 0  Number falls in past yr:  0 0 0 0  Injury with Fall?  0 1 0 0  Risk for fall  due to : No Fall Risks No Fall Risks No Fall Risks No Fall Risks   Follow up Falls prevention discussed Falls prevention discussed Falls prevention discussed Falls prevention discussed      Assessment & Plan  There are no diagnoses linked to this encounter.  I discussed the assessment and treatment plan with the patient. The patient was provided an opportunity to ask questions and all were answered. The patient agreed with the plan and demonstrated an understanding of the instructions.  The patient was advised to call back or seek an in-person evaluation if the symptoms worsen or if the condition fails to improve as anticipated.  I provided *** minutes of non-face-to-face time during this encounter.

## 2022-12-16 NOTE — Progress Notes (Signed)
Name: Johnathan Eaton   MRN: [id number]    DOB: August 22, 1970   Date:12/17/2022       Progress Note  Subjective  Chief Complaint  Follow Up  HPI  HTN: on Exforge and bp is slightly elevated today, offered to add Atenolol but he wants to hold off on seeing cardiologist. He went to [facility] with chest pain and palpitation, denies SOB. He will see cardiologist at the end of the month   GERD: seen by Gastroenterologist 09/09/2020 with worsening of symptoms , taking PPI once a day to control symptoms, he had normal EGD and colonoscopy 11/2020. Symptoms are stable, he still makes himself vomit a couple of times a week , due to regurgitation   GAD/Depression: he has a long history of anxiety / panic attacks / dysthymia, he has responded well to Citalopram and hydroxyzine in the past but when he feels better he stops medications but with time has recurrence of panic attacks and feels horrible. This has been going on for a while . He resumes medications and feels better again. Explained he should take Citalopram for the rest of his life. Recently went to [facility] with a panic attack due to chest pain he will see cardiologist end of July     Controlled Gout: uric acid level improve when he stopped HCTZ. No problems since . Last uric acid had improved, he does not like taking medications and we will hold off on adding allopurinol at this time    Hyperglycemia: he denies polyphagia, polydipsia or polyuria, last hgbA1C was normal at 5.3 %  Dyslipidemia: he is not ready for therapy at this time . Discussed high risk of heart attacks and strokes , he wants to hold off until he sees cardiologist   The 10-year ASCVD risk score (Arnett DK, et al., 2019) is: 11.8%   Values used to calculate the score:     Age: 52 years     Sex: Male     Is Non-Hispanic African American: Yes     Diabetic: No     Tobacco smoker: No     Systolic Blood Pressure: 144 mmHg     Is BP treated: Yes     HDL Cholesterol: 44 mg/dL     Total  Cholesterol: 146 mg/dL   Patient Active Problem List   Diagnosis Date Noted   Gout 04/18/2017   Allergic rhinitis, seasonal 02/09/2015   GAD (generalized anxiety disorder) 02/09/2015   Benign hypertension 02/09/2015   Blood glucose elevated 02/09/2015   Gastric reflux 02/09/2015   Obesity (BMI 30-39.9) 02/09/2015    Past Surgical History:  Procedure Laterality Date   COLONOSCOPY  12/04/2020   SHOULDER SURGERY Left    Rotator Cuff Repair   UPPER GASTROINTESTINAL ENDOSCOPY  12/04/2020   also has had in past in [place], 2007-2008    Family History  Problem Relation Age of Onset   Hypertension Mother    Dementia Mother    Lung cancer Mother        [name] through treatment   Pneumonia Mother        covid complications    Hypertension Father    Dementia Father    Hypertension Brother    Colon cancer Neg Hx    Esophageal cancer Neg Hx    Pancreatic cancer Neg Hx    Stomach cancer Neg Hx    Liver disease Neg Hx    Colon polyps Neg Hx    Rectal cancer Neg Hx  Social History   Tobacco Use   Smoking status: Never   Smokeless tobacco: Never  Substance Use Topics   Alcohol use: No    Alcohol/week: 0.0 standard drinks of alcohol     Current Outpatient Medications:    ALPRAZolam (XANAX) 0.5 MG tablet, Take 1 tablet (0.5 mg total) by mouth 2 (two) times daily as needed for anxiety., Disp: 30 tablet, Rfl: 0   amLODipine-valsartan (EXFORGE) 5-160 MG tablet, Take 1 tablet by mouth daily. For blood pressure, Disp: 90 tablet, Rfl: 1   buPROPion (WELLBUTRIN XL) 150 MG 24 hr tablet, Take 1 tablet (150 mg total) by mouth every morning. For depression/energy, Disp: 90 tablet, Rfl: 1   colchicine 0.6 MG tablet, TAKE 1 TABLET BY MOUTH DAILY AS NEEDEDED.TAKE 2 AT ONSET OF GOUT AND REPEAT 1 TIME AN HOUR AS NEEDED, Disp: 30 tablet, Rfl: 0   pantoprazole (PROTONIX) 40 MG tablet, Take 1 tablet (40 mg total) by mouth daily before supper. For heartburn, Disp: 90 tablet, Rfl: 1  No  Known Allergies  I personally reviewed active problem list, medication list, allergies, family history, social history, health maintenance with the patient/caregiver today.   ROS  Ten systems reviewed and is negative except as mentioned in HPI    Objective  Vitals:   12/17/22 1034  BP: (!) 144/86  Pulse: 93  Resp: 16  SpO2: 98%  Weight: 210 lb (95.3 kg)  Height: 5\' 9"  (1.753 m)    Body mass index is 31.01 kg/m.  Physical Exam  Constitutional: Patient appears well-developed and well-nourished. Obese  No distress.  HEENT: head atraumatic, normocephalic, pupils equal and reactive to light, neck supple Cardiovascular: Normal rate, regular rhythm and normal heart sounds.  No murmur heard. No BLE edema. Pulmonary/Chest: Effort normal and breath sounds normal. No respiratory distress. Abdominal: Soft.  There is no tenderness. Psychiatric: Patient has a normal mood and affect. behavior is normal. Judgment and thought content normal.    PHQ2/9:    12/17/2022   10:33 AM 12/16/2022   10:19 AM 04/14/2022    8:55 AM 09/26/2021    3:45 PM 07/14/2021    8:56 AM  Depression screen PHQ 2/9  Decreased Interest 1 1 0 0 0  Down, Depressed, Hopeless 1 1 0 0 0  PHQ - 2 Score 2 2 0 0 0  Altered sleeping 1 1 0 0 0  Tired, decreased energy 1 1 0 0 0  Change in appetite 0 0 0 0 0  Feeling bad or failure about yourself  1 1 0 0 0  Trouble concentrating 0 0 0 0 0  Moving slowly or fidgety/restless 0 0 0 0 0  Suicidal thoughts 0 0 0 0 0  PHQ-9 Score 5 5 0 0 0  Difficult doing work/chores  Somewhat difficult   Not difficult at all    phq 9 is positive   Fall Risk:    12/17/2022   10:33 AM 12/16/2022   10:18 AM 04/14/2022    8:55 AM 09/26/2021    3:45 PM 07/14/2021    8:56 AM  Fall Risk   Falls in the past year? 0 0 1 1 0  Number falls in past yr: 0  0 0 0  Injury with Fall? 0  0 1 0  Risk for fall due to : No Fall Risks No Fall Risks No Fall Risks No Fall Risks No Fall Risks  Follow  up Falls prevention discussed Falls prevention discussed Falls prevention discussed  Falls prevention discussed Falls prevention discussed      Functional Status Survey: Is the patient deaf or have difficulty hearing?: No Does the patient have difficulty seeing, even when wearing glasses/contacts?: No Does the patient have difficulty concentrating, remembering, or making decisions?: No Does the patient have difficulty walking or climbing stairs?: No Does the patient have difficulty dressing or bathing?: No Does the patient have difficulty doing errands alone such as visiting a doctor's office or shopping?: No    Assessment & Plan  1. Abnormal red blood cells  - Sickle Cell Scr  2. Benign hypertension  - amLODipine-valsartan (EXFORGE) 5-160 MG tablet; Take 1 tablet by mouth daily. For blood pressure  Dispense: 90 tablet; Refill: 1  3. Panic attack  - ALPRAZolam (XANAX) 0.5 MG tablet; Take 1 tablet (0.5 mg total) by mouth 2 (two) times daily as needed for anxiety.  Dispense: 30 tablet; Refill: 0  4. Dysthymia  - buPROPion (WELLBUTRIN XL) 150 MG 24 hr tablet; Take 1 tablet (150 mg total) by mouth every morning. For depression/energy  Dispense: 90 tablet; Refill: 1  5. Palpitation  - TSH  6. GAD (generalized anxiety disorder)  - buPROPion (WELLBUTRIN XL) 150 MG 24 hr tablet; Take 1 tablet (150 mg total) by mouth every morning. For depression/energy  Dispense: 90 tablet; Refill: 1 - ALPRAZolam (XANAX) 0.5 MG tablet; Take 1 tablet (0.5 mg total) by mouth 2 (two) times daily as needed for anxiety.  Dispense: 30 tablet; Refill: 0 - hydrOXYzine (ATARAX) 10 MG tablet; Take 1 tablet (10 mg total) by mouth at bedtime.  Dispense: 90 tablet; Refill: 1  7. GERD without esophagitis  - pantoprazole (PROTONIX) 40 MG tablet; Take 1 tablet (40 mg total) by mouth daily before supper. For heartburn  Dispense: 90 tablet; Refill: 1

## 2022-12-17 ENCOUNTER — Ambulatory Visit (INDEPENDENT_AMBULATORY_CARE_PROVIDER_SITE_OTHER): Payer: BC Managed Care – PPO | Admitting: Family Medicine

## 2022-12-17 ENCOUNTER — Encounter: Payer: Self-pay | Admitting: Family Medicine

## 2022-12-17 VITALS — BP 144/86 | HR 93 | Resp 16 | Ht 69.0 in | Wt 210.0 lb

## 2022-12-17 DIAGNOSIS — F410 Panic disorder [episodic paroxysmal anxiety] without agoraphobia: Secondary | ICD-10-CM

## 2022-12-17 DIAGNOSIS — F341 Dysthymic disorder: Secondary | ICD-10-CM | POA: Diagnosis not present

## 2022-12-17 DIAGNOSIS — I10 Essential (primary) hypertension: Secondary | ICD-10-CM

## 2022-12-17 DIAGNOSIS — R002 Palpitations: Secondary | ICD-10-CM | POA: Diagnosis not present

## 2022-12-17 DIAGNOSIS — K219 Gastro-esophageal reflux disease without esophagitis: Secondary | ICD-10-CM

## 2022-12-17 DIAGNOSIS — F411 Generalized anxiety disorder: Secondary | ICD-10-CM

## 2022-12-17 DIAGNOSIS — R718 Other abnormality of red blood cells: Secondary | ICD-10-CM

## 2022-12-17 MED ORDER — BUPROPION HCL ER (XL) 150 MG PO TB24
150.0000 mg | ORAL_TABLET | ORAL | 1 refills | Status: DC
Start: 2022-12-17 — End: 2023-06-18

## 2022-12-17 MED ORDER — HYDROXYZINE HCL 10 MG PO TABS
10.0000 mg | ORAL_TABLET | Freq: Every day | ORAL | 1 refills | Status: DC
Start: 2022-12-17 — End: 2023-06-18

## 2022-12-17 MED ORDER — AMLODIPINE BESYLATE-VALSARTAN 5-160 MG PO TABS
1.0000 | ORAL_TABLET | Freq: Every day | ORAL | 1 refills | Status: DC
Start: 2022-12-17 — End: 2023-06-18

## 2022-12-17 MED ORDER — ALPRAZOLAM 0.5 MG PO TABS
0.5000 mg | ORAL_TABLET | Freq: Two times a day (BID) | ORAL | 0 refills | Status: DC | PRN
Start: 2022-12-17 — End: 2023-08-02

## 2022-12-17 MED ORDER — PANTOPRAZOLE SODIUM 40 MG PO TBEC
40.0000 mg | DELAYED_RELEASE_TABLET | Freq: Every day | ORAL | 1 refills | Status: DC
Start: 2022-12-17 — End: 2023-06-18

## 2022-12-17 MED ORDER — CITALOPRAM HYDROBROMIDE 20 MG PO TABS
20.0000 mg | ORAL_TABLET | Freq: Every day | ORAL | 0 refills | Status: DC
Start: 2022-12-17 — End: 2023-03-12

## 2022-12-17 NOTE — Patient Instructions (Addendum)
Citalopram in am for anxiety daily if makes you sleep you can take it every night instead  Hydroxyzine 10 mg at night for anxiety and sleep  Take alprazolam only as needed panic attacks  You have taken all of this medication in the past and you did well with the regiment, you should never stop taking them   Take vitamin D 2000 units daily

## 2022-12-18 LAB — SICKLE CELL SCREEN: Sickle Solubility Test - HGBRFX: NEGATIVE

## 2022-12-18 LAB — TSH: TSH: 1.14 mIU/L (ref 0.40–4.50)

## 2022-12-28 ENCOUNTER — Encounter: Payer: Self-pay | Admitting: Internal Medicine

## 2022-12-28 ENCOUNTER — Ambulatory Visit: Payer: BC Managed Care – PPO | Attending: Internal Medicine | Admitting: Internal Medicine

## 2022-12-28 VITALS — BP 164/96 | HR 103 | Ht 69.0 in | Wt 214.4 lb

## 2022-12-28 DIAGNOSIS — R079 Chest pain, unspecified: Secondary | ICD-10-CM

## 2022-12-28 DIAGNOSIS — I10 Essential (primary) hypertension: Secondary | ICD-10-CM
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
## 2022-12-28 NOTE — Patient Instructions (Signed)
Medication Instructions:  Your physician recommends that you continue on your current medications as directed. Please refer to the Current Medication list given to you today.  *If you need a refill on your cardiac medications before your next appointment, please call your pharmacy*     Follow-Up: At [facility], you and your health needs are our priority.  As part of our continuing mission to provide you with exceptional heart care, we have created designated Provider Care Teams.  These Care Teams include your primary Cardiologist (physician) and Advanced Practice Providers (APPs -  Physician Assistants and Nurse Practitioners) who all work together to provide you with the care you need, when you need it.  Your next appointment:   F/u as needed   Provider:   Maisie Fus, MD

## 2023-02-05 ENCOUNTER — Encounter: Payer: Self-pay | Admitting: Pharmacist

## 2023-02-09 NOTE — Progress Notes (Unsigned)
Name: Johnathan Eaton   MRN: [id number]    DOB: 09/12/1970   Date:02/10/2023       Progress Note  Subjective  Chief Complaint  Annual Exam  HPI  Patient presents for annual CPE.  IPSS Questionnaire (AUA-7): Over the past month.   1)  How often have you had a sensation of not emptying your bladder completely after you finish urinating?  0 - Not at all  2)  How often have you had to urinate again less than two hours after you finished urinating? 1 - Less than 1 time in 5  3)  How often have you found you stopped and started again several times when you urinated?  1 - Less than 1 time in 5  4) How difficult have you found it to postpone urination?  0 - Not at all  5) How often have you had a weak urinary stream?  0 - Not at all  6) How often have you had to push or strain to begin urination?  0 - Not at all  7) How many times did you most typically get up to urinate from the time you went to bed until the time you got up in the morning?  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 eats on the go , therefore not as healthy  Exercise: continue regular activity  Last Dental Exam: up to date Last Eye Exam: he has a conus problem, and is due for a visit to ophthalmologist   Depression: phq 9 is negative    02/10/2023    3:17 PM 12/17/2022   10:33 AM 12/16/2022   10:19 AM 04/14/2022    8:55 AM 09/26/2021    3:45 PM  Depression screen PHQ 2/9  Decreased Interest 0 1 1 0 0  Down, Depressed, Hopeless 0 1 1 0 0  PHQ - 2 Score 0 2 2 0 0  Altered sleeping 0 1 1 0 0  Tired, decreased energy 0 1 1 0 0  Change in appetite 0 0 0 0 0  Feeling bad or failure about yourself  0 1 1 0 0  Trouble concentrating 0 0 0 0 0  Moving slowly or fidgety/restless 0 0 0 0 0  Suicidal thoughts 0 0 0 0 0  PHQ-9 Score 0 5 5 0 0  Difficult doing work/chores   Somewhat  difficult      Hypertension:  BP Readings from Last 3 Encounters:  02/10/23 130/72  12/28/22 (!) 164/96  12/17/22 (!) 144/86    Obesity: Wt Readings from Last 3 Encounters:  02/10/23 218 lb (98.9 kg)  12/28/22 214 lb 6.4 oz (97.3 kg)  12/17/22 210 lb (95.3 kg)   BMI Readings from Last 3 Encounters:  02/10/23 32.19 kg/m  12/28/22 31.66 kg/m  12/17/22 31.01 kg/m     Lipids:  Lab Results  Component Value Date   CHOL 146 04/14/2022   CHOL 152 08/13/2020   CHOL 147 06/15/2019   Lab Results  Component Value Date   HDL 44 04/14/2022   HDL 46 08/13/2020   HDL 45 06/15/2019   Lab Results  Component Value Date   LDLCALC 86 04/14/2022   LDLCALC 89 08/13/2020   LDLCALC 87 06/15/2019   Lab Results  Component Value Date   TRIG 72  04/14/2022   TRIG 78 08/13/2020   TRIG 60 06/15/2019   Lab Results  Component Value Date   CHOLHDL 3.3 04/14/2022   CHOLHDL 3.3 08/13/2020   CHOLHDL 3.3 06/15/2019   No results found for: "LDLDIRECT" Glucose:  Glucose, Bld  Date Value Ref Range Status  12/14/2022 106 (H) 70 - 99 mg/dL Final    Comment:    Glucose reference range applies only to samples taken after fasting for at least 8 hours.  04/14/2022 92 65 - 99 mg/dL Final    Comment:    .            Fasting reference interval .   10/22/2020 108 (H) 70 - 99 mg/dL Final    Comment:    Glucose reference range applies only to samples taken after fasting for at least 8 hours.    Flowsheet Row Office Visit from 02/10/2023 in [facility]  AUDIT-C Score 0       Married STD testing and prevention (HIV/chl/gon/syphilis): N/A Sexual history:  married , one partner, some delayed ejaculation due to citalopram  Hep C Screening: 06/15/19 Skin cancer: Discussed monitoring for atypical lesions Colorectal cancer: 12/04/20 Prostate cancer:   Lab Results  Component Value Date   PSA Normal, 0.4 11/29/2009     Lung cancer:  Low Dose CT Chest recommended if  Age 46-80 years, 30 pack-year currently smoking OR have quit w/in 15years. Patient is not  a candidate for screening   AAA: The USPSTF recommends one-time screening with ultrasonography in men ages 1 to 75 years who have ever smoked. Patient  is not a candidate for screening  ECG:  12/18/22  Vaccines:   HPV: N/A Tdap: up to date Shingrix: discussed with patient  - he will think about it  Pneumonia: N/A Flu: refused  COVID-19: N/A  Advanced Care Planning: A voluntary discussion about advance care planning including the explanation and discussion of advance directives.  Discussed health care proxy and Living will, and the patient was able to identify a health care proxy as wife.  Patient  has a living will and power of attorney of health care   Patient Active Problem List   Diagnosis Date Noted   Keratoconus of both eyes 02/10/2023   Gout 04/18/2017   Allergic rhinitis, seasonal 02/09/2015   GAD (generalized anxiety disorder) 02/09/2015   Benign hypertension 02/09/2015   Blood glucose elevated 02/09/2015   Gastric reflux 02/09/2015   Obesity (BMI 30-39.9) 02/09/2015    Past Surgical History:  Procedure Laterality Date   COLONOSCOPY  12/04/2020   SHOULDER SURGERY Left    Rotator Cuff Repair   UPPER GASTROINTESTINAL ENDOSCOPY  12/04/2020   also has had in past in [place], 2007-2008    Family History  Problem Relation Age of Onset   Hypertension Mother    Dementia Mother    Lung cancer Mother        [name] through treatment   Pneumonia Mother        covid complications    Hypertension Father    Dementia Father    Hypertension Brother    Colon cancer Neg Hx    Esophageal cancer Neg Hx    Pancreatic cancer Neg Hx    Stomach cancer Neg Hx    Liver disease Neg Hx    Colon polyps Neg Hx    Rectal cancer Neg Hx     Social History   Socioeconomic History   Marital status: Married  Spouse name: [name]    Number of children: 1   Years of education: Not on file    Highest education level: 12th grade  Occupational History   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   Tobacco Use   Smoking status: Never   Smokeless tobacco: Never  Vaping Use   Vaping status: Never Used  Substance and Sexual Activity   Alcohol use: No    Alcohol/week: 0.0 standard drinks of alcohol   Drug use: No   Sexual activity: Yes    Partners: Female  Other Topics Concern   Not on file  Social History Narrative   Not on file   Social Determinants of Health   Financial Resource Strain: Low Risk  (02/10/2023)   Overall Financial Resource Strain (CARDIA)    Difficulty of Paying Living Expenses: Not hard at all  Food Insecurity: No Food Insecurity (02/10/2023)   Hunger Vital Sign    Worried About Running Out of Food in the Last Year: Never true    Ran Out of Food in the Last Year: Never true  Transportation Needs: No Transportation Needs (02/10/2023)   PRAPARE - Administrator, Civil Service (Medical): No    Lack of Transportation (Non-Medical): No  Physical Activity: Sufficiently Active (11/25/2020)   Exercise Vital Sign    Days of Exercise per Week: 5 days    Minutes of Exercise per Session: 30 min  Stress: No Stress Concern Present (02/10/2023)   [employer] of Occupational Health - Occupational Stress Questionnaire    Feeling of Stress : Not at all  Social Connections: Moderately Integrated (02/10/2023)   Social Connection and Isolation Panel [NHANES]    Frequency of Communication with Friends and Family: More than three times a week    Frequency of Social Gatherings with Friends and Family: Twice a week    Attends Religious Services: More than 4 times per year    Active Member of [employer] or Organizations: No    Attends Banker Meetings: Never    Marital Status: Married  Catering manager Violence: Not At Risk (02/10/2023)   Humiliation, Afraid, Rape, and Kick questionnaire    Fear of Current or Ex-Partner: No    Emotionally Abused: No     Physically Abused: No    Sexually Abused: No     Current Outpatient Medications:    ALPRAZolam (XANAX) 0.5 MG tablet, Take 1 tablet (0.5 mg total) by mouth 2 (two) times daily as needed for anxiety., Disp: 30 tablet, Rfl: 0   amLODipine-valsartan (EXFORGE) 5-160 MG tablet, Take 1 tablet by mouth daily. For blood pressure, Disp: 90 tablet, Rfl: 1   buPROPion (WELLBUTRIN XL) 150 MG 24 hr tablet, Take 1 tablet (150 mg total) by mouth every morning. For depression/energy, Disp: 90 tablet, Rfl: 1   citalopram (CELEXA) 20 MG tablet, Take 1 tablet (20 mg total) by mouth daily., Disp: 90 tablet, Rfl: 0   colchicine 0.6 MG tablet, TAKE 1 TABLET BY MOUTH DAILY AS NEEDEDED.TAKE 2 AT ONSET OF GOUT AND REPEAT 1 TIME AN HOUR AS NEEDED, Disp: 30 tablet, Rfl: 0   hydrOXYzine (ATARAX) 10 MG tablet, Take 1 tablet (10 mg total) by mouth at bedtime., Disp: 90 tablet, Rfl: 1   pantoprazole (PROTONIX) 40 MG tablet, Take 1 tablet (40 mg total) by mouth daily before supper. For heartburn, Disp: 90 tablet, Rfl: 1  No Known Allergies   ROS  Constitutional: Negative for fever or weight change.  Respiratory: Negative for cough and  shortness of breath.   Cardiovascular: Negative for chest pain or palpitations.  Gastrointestinal: Negative for abdominal pain, no bowel changes.  Musculoskeletal: Negative for gait problem or joint swelling.  Skin: Negative for rash.  Neurological: Negative for dizziness or headache.  No other specific complaints in a complete review of systems (except as listed in HPI above).    Objective  Vitals:   02/10/23 1513  BP: 130/72  Pulse: 87  Resp: 16  SpO2: 98%  Weight: 218 lb (98.9 kg)  Height: 5\' 9"  (1.753 m)    Body mass index is 32.19 kg/m.  Physical Exam  Constitutional: Patient appears well-developed and well-nourished. Obese  No distress.  HEENT: head atraumatic, normocephalic, pupils equal and reactive to light, neck supple Cardiovascular: Normal rate, regular  rhythm and normal heart sounds.  No murmur heard. No BLE edema. Pulmonary/Chest: Effort normal and breath sounds normal. No respiratory distress. Abdominal: Soft.  There is no tenderness. Psychiatric: Patient has a normal mood and affect. behavior is normal. Judgment and thought content normal.   Recent Results (from the past 2160 hour(s))  Basic metabolic panel     Status: Abnormal   Collection Time: 12/14/22 11:58 AM  Result Value Ref Range   Sodium 137 135 - 145 mmol/L   Potassium 3.6 3.5 - 5.1 mmol/L   Chloride 103 98 - 111 mmol/L   CO2 26 22 - 32 mmol/L   Glucose, Bld 106 (H) 70 - 99 mg/dL    Comment: Glucose reference range applies only to samples taken after fasting for at least 8 hours.   BUN 15 6 - 20 mg/dL   Creatinine, Ser 6.29 0.61 - 1.24 mg/dL   Calcium 9.2 8.9 - 52.8 mg/dL   GFR, Estimated >41 >32 mL/min    Comment: (NOTE) Calculated using the CKD-EPI Creatinine Equation (2021)    Anion gap 8 5 - 15    Comment: Performed at [facility], [address] [place], [place] [address]  CBC     Status: Abnormal   Collection Time: 12/14/22 11:58 AM  Result Value Ref Range   WBC 7.6 4.0 - 10.5 K/uL   RBC 6.38 (H) 4.22 - 5.81 MIL/uL   Hemoglobin 15.8 13.0 - 17.0 g/dL   HCT 27.2 53.6 - 64.4 %   MCV 76.2 (L) 80.0 - 100.0 fL   MCH 24.8 (L) 26.0 - 34.0 pg   MCHC 32.5 30.0 - 36.0 g/dL   RDW 03.4 74.2 - 59.5 %   Platelets 303 150 - 400 K/uL   nRBC 0.0 0.0 - 0.2 %    Comment: Performed at [facility], [address] [place], [place] [address]  Troponin I (High Sensitivity)     Status: None   Collection Time: 12/14/22 11:58 AM  Result Value Ref Range   Troponin I (High Sensitivity) 3 <18 ng/L    Comment: (NOTE) Elevated high sensitivity troponin I (hsTnI) values and significant  changes across serial measurements may suggest ACS but many other  chronic and acute conditions are known to elevate hsTnI results.  Refer to the "Links" section for chest pain  algorithms and additional  guidance. Performed at [facility], [address] [place], [place] [address]   Sickle Cell Scr     Status: None   Collection Time: 12/17/22 11:06 AM  Result Value Ref Range   Sickle Solubility Test - HGBRFX NEGATIVE NEGATIVE    Comment: . Hemoglobin solubility testing alone is insufficient for detecting or  confirming the presence of sickling hemoglobins in some situations. Additional testing may be required for diagnosis of hemoglobinopathies. . For additional information, please refer to  http://education.questdiagn[facility].com/faq/FAQ99v1 (This link is being provided for informational/ educational purposes only.) .   TSH     Status: None   Collection Time: 12/17/22 11:06 AM  Result Value Ref Range   TSH 1.14 0.40 - 4.50 mIU/L     Fall Risk:    02/10/2023    3:12 PM 12/17/2022   10:33 AM 12/16/2022   10:18 AM 04/14/2022    8:55 AM 09/26/2021    3:45 PM  Fall Risk   Falls in the past year? 0 0 0 1 1  Number falls in past yr: 0 0  0 0  Injury with Fall? 0 0  0 1  Risk for fall due to : No Fall Risks No Fall Risks No Fall Risks No Fall Risks No Fall Risks  Follow up Falls prevention discussed Falls prevention discussed Falls prevention discussed Falls prevention discussed Falls prevention discussed     Functional Status Survey: Is the patient deaf or have difficulty hearing?: No Does the patient have difficulty seeing, even when wearing glasses/contacts?: No Does the patient have difficulty concentrating, remembering, or making decisions?: No Does the patient have difficulty walking or climbing stairs?: No Does the patient have difficulty dressing or bathing?: No Does the patient have difficulty doing errands alone such as visiting a doctor's office or shopping?: No    Assessment & Plan  1. Well adult exam  Continue medications, return for regular follow up Schedule eye exam   -Prostate cancer screening and PSA options  (with potential risks and benefits of testing vs not testing) were discussed along with recent recs/guidelines. -USPSTF grade A and B recommendations reviewed with patient; age-appropriate recommendations, preventive care, screening tests, etc discussed and encouraged; healthy living encouraged; see AVS for patient education given to patient -Discussed importance of 150 minutes of physical activity weekly, eat two servings of fish weekly, eat one serving of tree nuts ( cashews, pistachios, pecans, almonds.Marland Kitchen) every other day, eat 6 servings of fruit/vegetables daily and drink plenty of water and avoid sweet beverages.  -Reviewed Health Maintenance: yes

## 2023-02-10 ENCOUNTER — Encounter: Payer: Self-pay | Admitting: Family Medicine

## 2023-02-10 ENCOUNTER — Ambulatory Visit (INDEPENDENT_AMBULATORY_CARE_PROVIDER_SITE_OTHER): Payer: BC Managed Care – PPO | Admitting: Family Medicine

## 2023-02-10 VITALS — BP 130/72 | HR 87 | Resp 16 | Ht 69.0 in | Wt 218.0 lb

## 2023-02-10 DIAGNOSIS — Z0000 Encounter for general adult medical examination without abnormal findings: Secondary | ICD-10-CM

## 2023-02-10 DIAGNOSIS — H18603 Keratoconus, unspecified, bilateral: Secondary | ICD-10-CM | POA: Insufficient documentation

## 2023-02-10 NOTE — Patient Instructions (Signed)
Get shingrix at local pharmacy

## 2023-02-16 IMAGING — CR DG CHEST 2V
2 series · 2 of 2 positions shown · non-contrast
Comparison: Chest x-ray 12/12/2006.

CLINICAL DATA: 50-year-old male with history of left-sided chest
pain

EXAM:
CHEST - 2 VIEW

[chest pa]
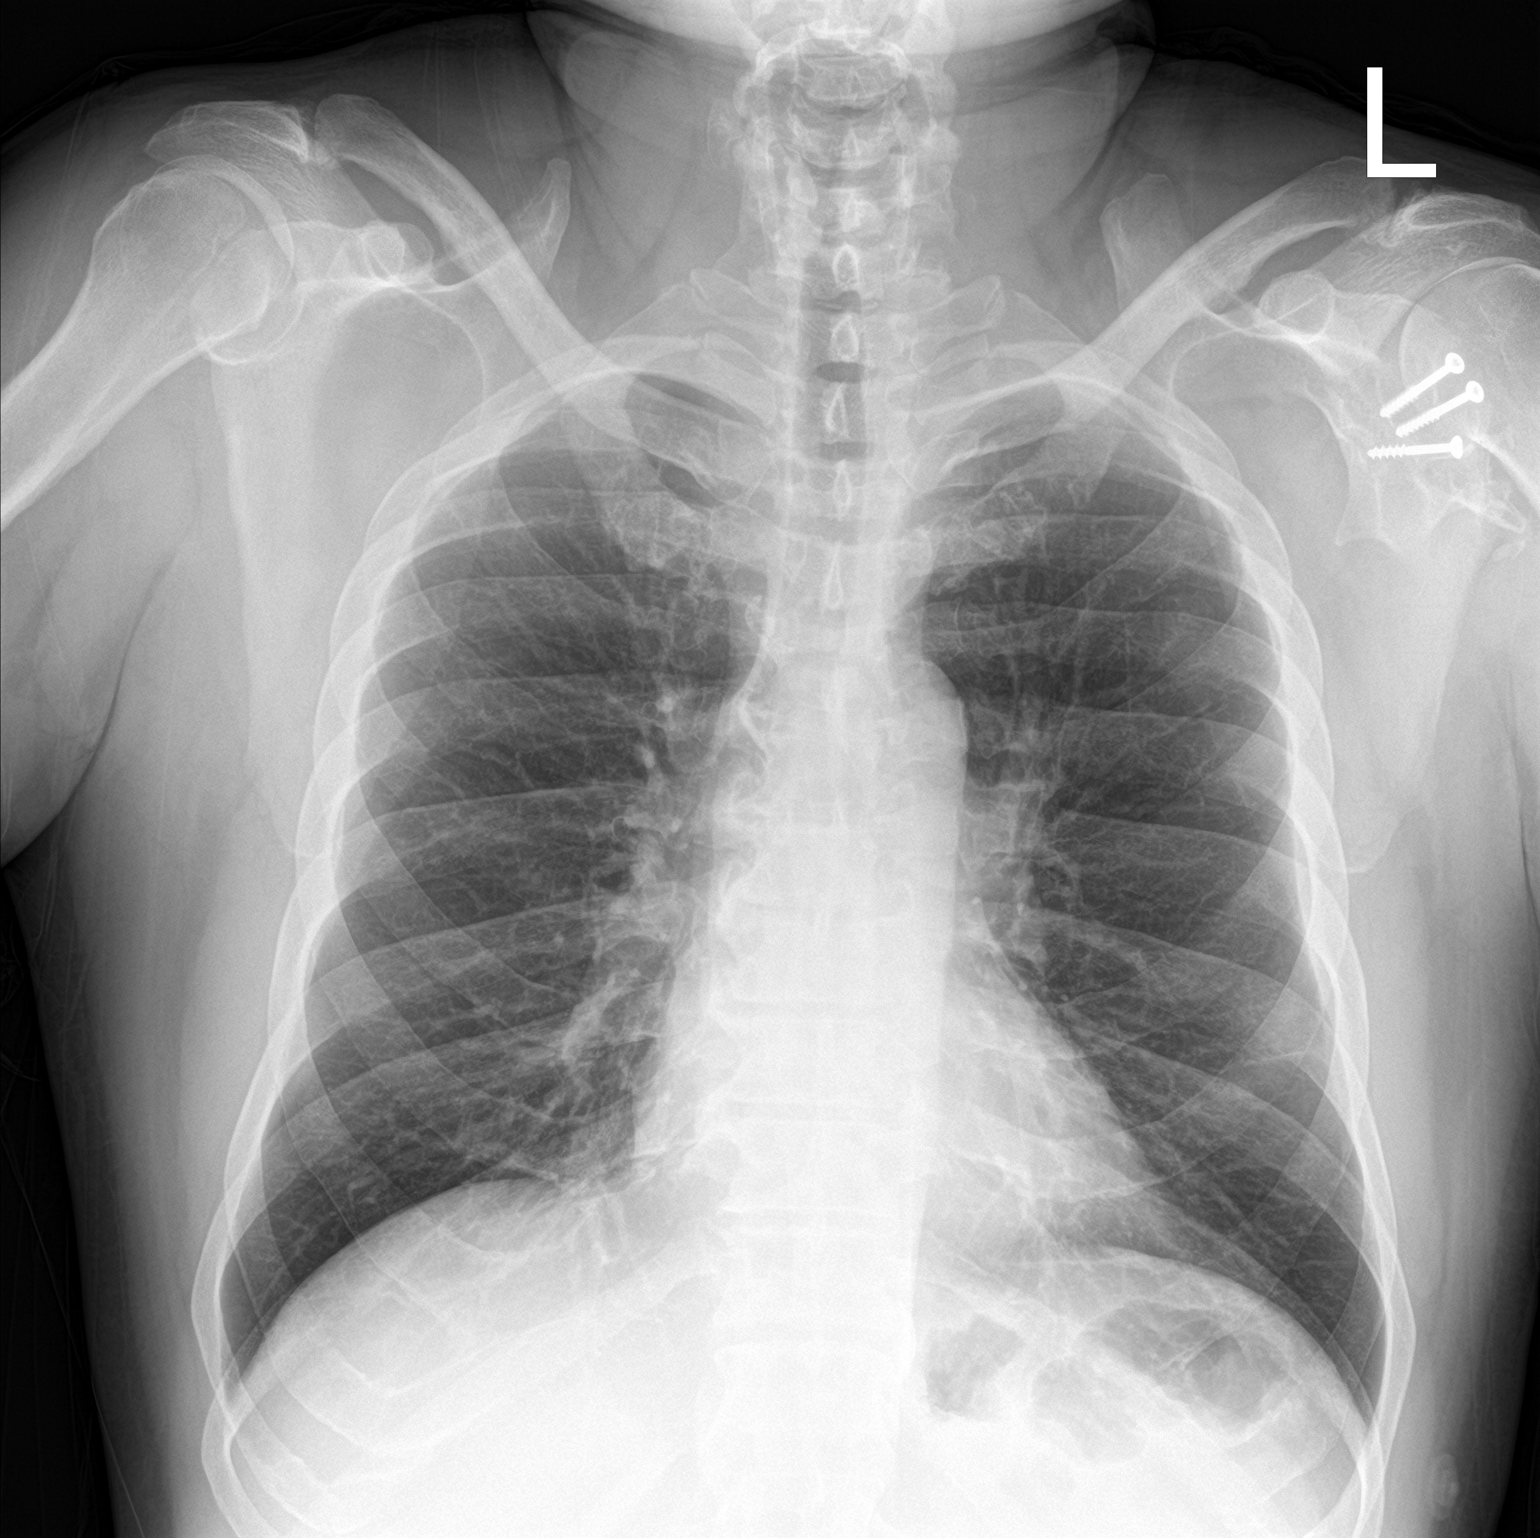

[chest lat]
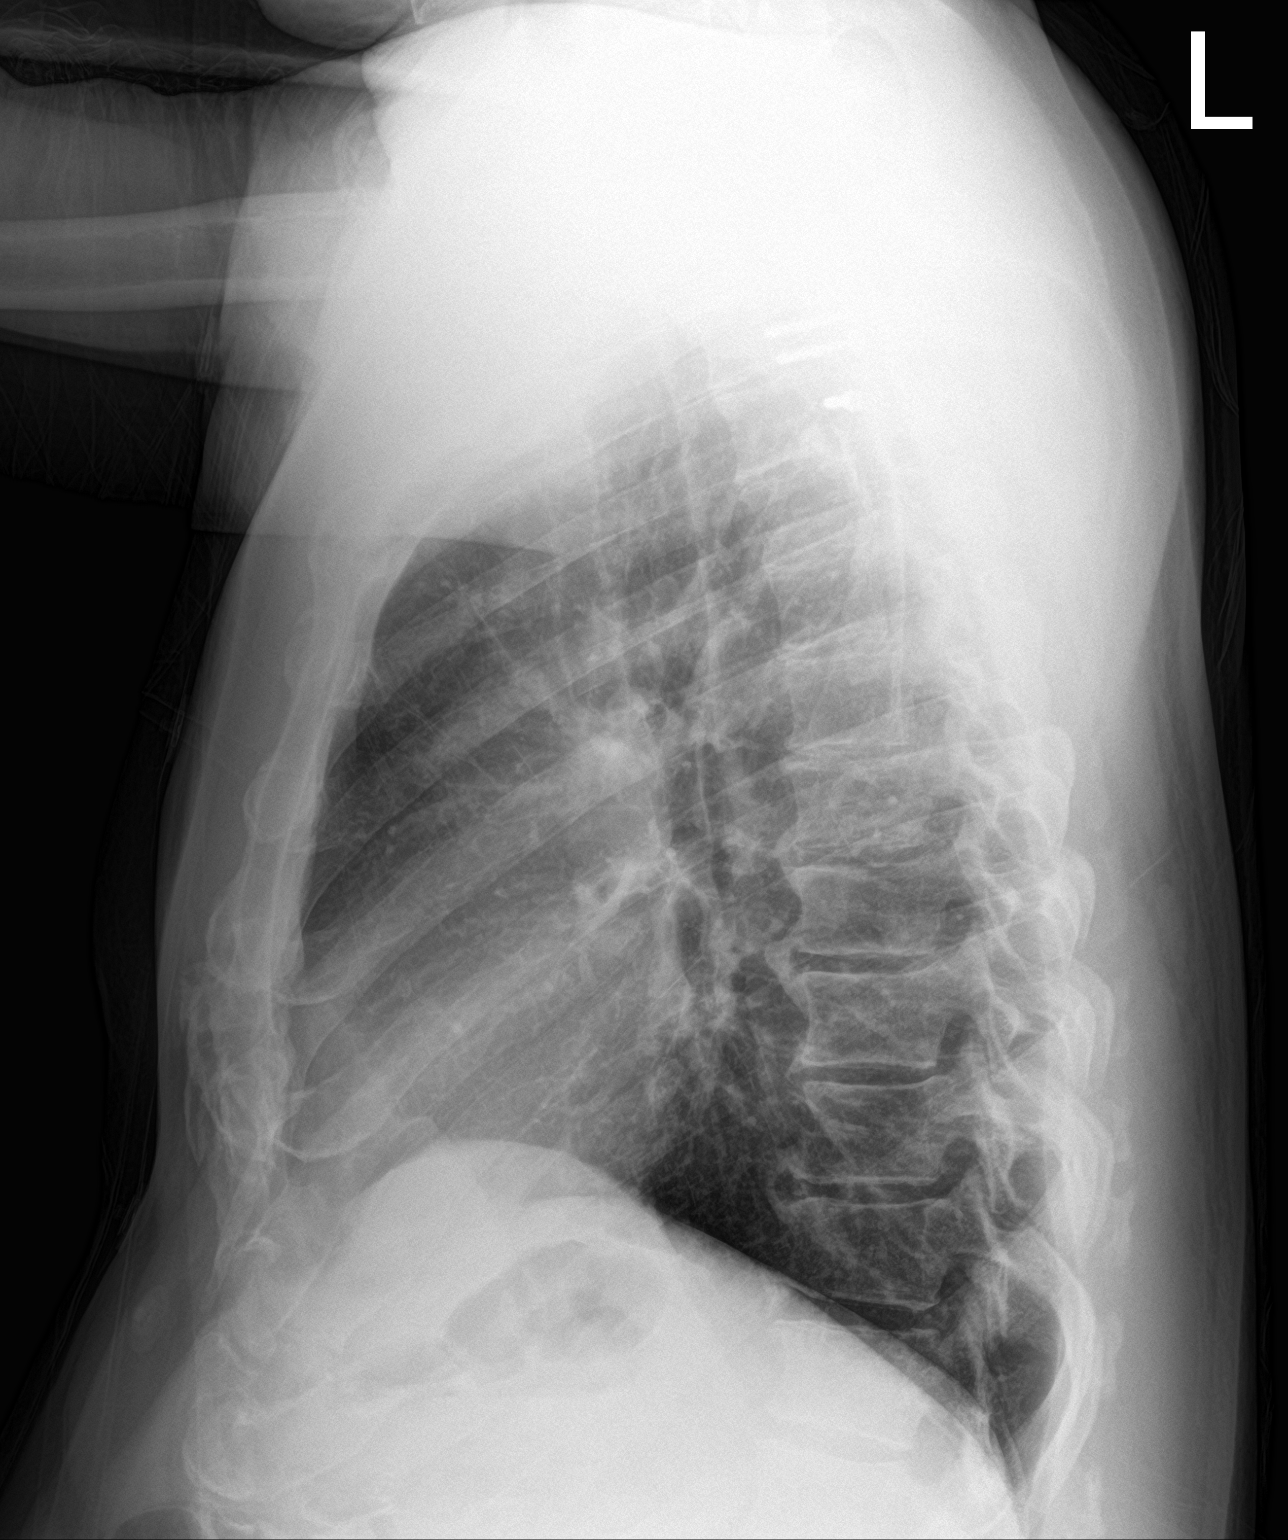

[2 of 2 positions shown; findings below may reference images not displayed]

FINDINGS: Lung volumes are normal. No consolidative airspace disease. No
pleural effusions. No pneumothorax. No pulmonary nodule or mass
noted. Pulmonary vasculature and the cardiomediastinal silhouette
are within normal limits. Orthopedic fixation screws in the left
scapula.
IMPRESSION: No radiographic evidence of acute cardiopulmonary disease.

## 2023-03-12 ENCOUNTER — Other Ambulatory Visit: Payer: Self-pay | Admitting: Family Medicine

## 2023-03-12 DIAGNOSIS — F411 Generalized anxiety disorder: Secondary | ICD-10-CM

## 2023-03-12 DIAGNOSIS — F341 Dysthymic disorder: Secondary | ICD-10-CM

## 2023-06-18 ENCOUNTER — Other Ambulatory Visit: Payer: Self-pay | Admitting: Family Medicine

## 2023-06-18 DIAGNOSIS — F341 Dysthymic disorder: Secondary | ICD-10-CM

## 2023-06-18 DIAGNOSIS — F411 Generalized anxiety disorder: Secondary | ICD-10-CM

## 2023-06-18 DIAGNOSIS — I10 Essential (primary) hypertension: Secondary | ICD-10-CM

## 2023-06-18 DIAGNOSIS — K219 Gastro-esophageal reflux disease without esophagitis: Secondary | ICD-10-CM

## 2023-07-09 ENCOUNTER — Encounter: Payer: Self-pay | Admitting: Family Medicine

## 2023-07-09 ENCOUNTER — Ambulatory Visit: Payer: BC Managed Care – PPO | Admitting: Family Medicine

## 2023-07-09 VITALS — BP 140/84 | HR 96 | Resp 16 | Ht 69.0 in | Wt 222.0 lb

## 2023-07-09 DIAGNOSIS — R399 Unspecified symptoms and signs involving the genitourinary system: Secondary | ICD-10-CM | POA: Diagnosis not present

## 2023-07-09 DIAGNOSIS — I10 Essential (primary) hypertension: Secondary | ICD-10-CM | POA: Diagnosis not present

## 2023-07-09 DIAGNOSIS — N399 Disorder of urinary system, unspecified: Secondary | ICD-10-CM

## 2023-07-09 MED ORDER — TAMSULOSIN HCL 0.4 MG PO CAPS: 0.4000 mg | ORAL_CAPSULE | Freq: Every day | ORAL | 0 refills | Status: DC

## 2023-07-09 NOTE — Progress Notes (Signed)
 Name: Johnathan Eaton   MRN: [id number]    DOB: [birth year]   Date:07/09/2023       Progress Note  Subjective  Chief Complaint  Chief Complaint  Patient presents with   prostate concern    Pt unable to urinate completely    HPI   He states over the past few months he noticed inability to completely empty his bladder, it started gradually, he states he can sleep through the night, no hematuria or dysuria. He has been taking saw palmetto otc over the past 2 weeks and symptoms have improved . He does not have a family history of prostate cancer, his father has BPH. He is very anxious about the possibility of prostate cancer. He denies any testicular pain, rectal pain, fever or chills. Appetite is normal, no change in weight   IPSS Questionnaire (AUA-7): Over the past month.   1)  How often have you had a sensation of not emptying your bladder completely after you finish urinating?  3 - About half the time  2)  How often have you had to urinate again less than two hours after you finished urinating? 3 - About half the time  3)  How often have you found you stopped and started again several times when you urinated?  0 - Not at all  4) How difficult have you found it to postpone urination?  0 - Not at all  5) How often have you had a weak urinary stream?  3 - About half the time  6) How often have you had to push or strain to begin urination?  0 - Not at all  7) How many times did you most typically get up to urinate from the time you went to bed until the time you got up in the morning?  0 - None  Total score:  0-7 mildly symptomatic   8-19 moderately symptomatic   20-35 severely symptomatic    HTN :bp is elevated , he has been taking bp medications and states at home bp is at goal, he has bene very anxious about his LUTS . He wants to continue current dose and recheck bp next month during his next follow up   Patient Active Problem List   Diagnosis Date Noted   Keratoconus of both eyes  02/10/2023   Gout 04/18/2017   Allergic rhinitis, seasonal 02/09/2015   GAD (generalized anxiety disorder) 02/09/2015   Benign hypertension 02/09/2015   Blood glucose elevated 02/09/2015   Gastric reflux 02/09/2015   Obesity (BMI 30-39.9) 02/09/2015    Social History   Tobacco Use   Smoking status: Never   Smokeless tobacco: Never  Substance Use Topics   Alcohol use: No    Alcohol/week: 0.0 standard drinks of alcohol     Current Outpatient Medications:    ALPRAZolam  (XANAX ) 0.5 MG tablet, Take 1 tablet (0.5 mg total) by mouth 2 (two) times daily as needed for anxiety., Disp: 30 tablet, Rfl: 0   amLODipine -valsartan  (EXFORGE ) 5-160 MG tablet, TAKE 1 TABLET BY MOUTH DAILY FOR BLOOD PRESSURE, Disp: 90 tablet, Rfl: 0   buPROPion  (WELLBUTRIN  XL) 150 MG 24 hr tablet, TAKE 1 TABLET (150 MG TOTAL) BY MOUTH EVERY MORNING. FOR DEPRESSION/ENERGY, Disp: 90 tablet, Rfl: 0   citalopram  (CELEXA ) 20 MG tablet, TAKE 1 TABLET BY MOUTH EVERY DAY, Disp: 90 tablet, Rfl: 1   hydrOXYzine  (ATARAX ) 10 MG tablet, TAKE 1 TABLET BY MOUTH EVERYDAY AT BEDTIME, Disp: 90 tablet, Rfl: 0  pantoprazole  (PROTONIX ) 40 MG tablet, TAKE 1 TABLET (40 MG TOTAL) BY MOUTH DAILY BEFORE SUPPER. FOR HEARTBURN, Disp: 90 tablet, Rfl: 0   tamsulosin  (FLOMAX ) 0.4 MG CAPS capsule, Take 1 capsule (0.4 mg total) by mouth daily., Disp: 30 capsule, Rfl: 0   colchicine  0.6 MG tablet, TAKE 1 TABLET BY MOUTH DAILY AS NEEDEDED.TAKE 2 AT ONSET OF GOUT AND REPEAT 1 TIME AN HOUR AS NEEDED (Patient not taking: Reported on 07/09/2023), Disp: 30 tablet, Rfl: 0  No Known Allergies  ROS  Ten systems reviewed and is negative except as mentioned in HPI    Objective  Vitals:   07/09/23 1445  BP: (!) 140/84  Pulse: 96  Resp: 16  SpO2: 99%  Weight: 222 lb (100.7 kg)  Height: 5' 9 (1.753 m)    Body mass index is 32.78 kg/m.   Physical Exam  Constitutional: Patient appears well-developed and well-nourished. Obese  No distress.  HEENT:  head atraumatic, normocephalic, pupils equal and reactive to light, neck supple Cardiovascular: Normal rate, regular rhythm and normal heart sounds.  No murmur heard. No BLE edema. Pulmonary/Chest: Effort normal and breath sounds normal. No respiratory distress. Abdominal: Soft.  There is no tenderness. Rectal: no masses, normal consistency  Psychiatric: Patient has a normal mood and affect. behavior is normal. Judgment and thought content normal.   Assessment & Plan  1. Urinary problem in male (Primary)  - PSA - CULTURE, URINE COMPREHENSIVE - Urinalysis, microscopic only  2. Lower urinary tract symptoms (LUTS)  - PSA - tamsulosin  (FLOMAX ) 0.4 MG CAPS capsule; Take 1 capsule (0.4 mg total) by mouth daily.  Dispense: 30 capsule; Refill: 0 - CULTURE, URINE COMPREHENSIVE - Urinalysis, microscopic only  3. Benign hypertension   We will continue to monitor , he was anxious today

## 2023-07-12 ENCOUNTER — Other Ambulatory Visit: Payer: Self-pay | Admitting: Family Medicine

## 2023-07-12 ENCOUNTER — Encounter: Payer: Self-pay | Admitting: Family Medicine

## 2023-07-12 LAB — URINALYSIS, MICROSCOPIC ONLY
Bacteria, UA: NONE SEEN /HPF
Hyaline Cast: NONE SEEN /LPF
RBC / HPF: NONE SEEN /HPF (ref 0–2)
Squamous Epithelial / HPF: NONE SEEN /HPF (ref <=5)

## 2023-07-12 LAB — CULTURE, URINE COMPREHENSIVE
MICRO NUMBER:: 16061207
RESULT:: NO GROWTH
SPECIMEN QUALITY:: ADEQUATE

## 2023-07-12 LAB — PSA: PSA: 30.05 ng/mL — ABNORMAL HIGH (ref <=4.00)

## 2023-07-12 MED ORDER — CIPROFLOXACIN HCL 250 MG PO TABS: 250.0000 mg | ORAL_TABLET | Freq: Two times a day (BID) | ORAL | 0 refills | Status: DC

## 2023-08-01 ENCOUNTER — Other Ambulatory Visit: Payer: Self-pay | Admitting: Family Medicine

## 2023-08-01 DIAGNOSIS — R399 Unspecified symptoms and signs involving the genitourinary system: Secondary | ICD-10-CM

## 2023-08-02 ENCOUNTER — Ambulatory Visit (INDEPENDENT_AMBULATORY_CARE_PROVIDER_SITE_OTHER): Payer: BC Managed Care – PPO | Admitting: Family Medicine

## 2023-08-02 ENCOUNTER — Encounter: Payer: Self-pay | Admitting: Family Medicine

## 2023-08-02 VITALS — BP 128/84 | HR 92 | Resp 16 | Ht 69.0 in | Wt 221.0 lb

## 2023-08-02 DIAGNOSIS — I10 Essential (primary) hypertension: Secondary | ICD-10-CM | POA: Diagnosis not present

## 2023-08-02 DIAGNOSIS — F341 Dysthymic disorder: Secondary | ICD-10-CM

## 2023-08-02 DIAGNOSIS — R399 Unspecified symptoms and signs involving the genitourinary system: Secondary | ICD-10-CM | POA: Diagnosis not present

## 2023-08-02 DIAGNOSIS — K219 Gastro-esophageal reflux disease without esophagitis: Secondary | ICD-10-CM

## 2023-08-02 DIAGNOSIS — F411 Generalized anxiety disorder: Secondary | ICD-10-CM

## 2023-08-02 DIAGNOSIS — R972 Elevated prostate specific antigen [PSA]: Secondary | ICD-10-CM

## 2023-08-02 DIAGNOSIS — E785 Hyperlipidemia, unspecified: Secondary | ICD-10-CM

## 2023-08-02 DIAGNOSIS — F410 Panic disorder [episodic paroxysmal anxiety] without agoraphobia: Secondary | ICD-10-CM

## 2023-08-02 DIAGNOSIS — M109 Gout, unspecified: Secondary | ICD-10-CM

## 2023-08-02 MED ORDER — AMLODIPINE BESYLATE-VALSARTAN 5-160 MG PO TABS
1.0000 | ORAL_TABLET | Freq: Every day | ORAL | 1 refills | Status: DC
Start: 2023-08-02 — End: 2024-03-27

## 2023-08-02 MED ORDER — ALPRAZOLAM 0.5 MG PO TABS
0.5000 mg | ORAL_TABLET | Freq: Two times a day (BID) | ORAL | 0 refills | Status: DC | PRN
Start: 2023-08-02 — End: 2024-04-18

## 2023-08-02 MED ORDER — PANTOPRAZOLE SODIUM 40 MG PO TBEC
40.0000 mg | DELAYED_RELEASE_TABLET | Freq: Every day | ORAL | 1 refills | Status: DC
Start: 2023-08-02 — End: 2024-04-18

## 2023-08-02 MED ORDER — TAMSULOSIN HCL 0.4 MG PO CAPS
0.4000 mg | ORAL_CAPSULE | Freq: Every day | ORAL | 0 refills | Status: DC
Start: 2023-08-02 — End: 2023-09-13

## 2023-08-02 MED ORDER — BUPROPION HCL ER (XL) 150 MG PO TB24
150.0000 mg | ORAL_TABLET | ORAL | 1 refills | Status: DC
Start: 2023-08-02 — End: 2024-04-18

## 2023-08-02 NOTE — Progress Notes (Signed)
 Name: Johnathan Eaton   MRN: [id number]    DOB: [birth year]   Date:08/02/2023       Progress Note  Subjective  Chief Complaint  Chief Complaint  Patient presents with   Medical Management of Chronic Issues   HPI   HTN: on Exforge and bp is slightly elevated today, offered to add Atenolol  He went to [facility] with chest pain and palpitation, denies SOB. Seen by cardiologist Summer 2024 and given reassurance   GERD: seen by Gastroenterologist 09/09/2020 with worsening of symptoms , taking PPI once a day to control symptoms, he had normal EGD and colonoscopy 11/2020. Symptoms are stable, only has to vomit when overeating, no episodes in months    GAD/Depression: he has a long history of anxiety / panic attacks / dysthymia, he states willing to resume taking Citalopram and wellbutrin XL daily to control symptoms, taking alprazolam prn and  last rx was given 11/2022.   He worries about everything, currently worried about his prostate problems   Controlled Gout: uric acid level improve when he stopped HCTZ. No problems since . Last uric acid had improved, but not at goal, he does not like taking medications and no symptoms so we will hold off on medications   Hyperglycemia: he denies polyphagia, polydipsia or polyuria, last hgbA1C was normal    Dyslipidemia: Discussed high risk of heart attacks and strokes , he refuses statin therapy at this time    The 10-year ASCVD risk score (Arnett DK, et al., 2019) is: 10%   Values used to calculate the score:     Age: 53 years     Sex: Male     Is Non-Hispanic African American: Yes     Diabetic: No     Tobacco smoker: No     Systolic Blood Pressure: 128 mmHg     Is BP treated: Yes     HDL Cholesterol: 44 mg/dL     Total Cholesterol: 146 mg/dL    IPSS Questionnaire (AUA-7): Over the past month.   1)  How often have you had a sensation of not emptying your bladder completely after you finish urinating?  3 - About half the time  2)  How often have you  had to urinate again less than two hours after you finished urinating? 3 - About half the time  3)  How often have you found you stopped and started again several times when you urinated?  1 - Less than 1 time in 5  4) How difficult have you found it to postpone urination?  0 - Not at all  5) How often have you had a weak urinary stream?  1 - Less than 1 time in 5  6) How often have you had to push or strain to begin urination?  1 - Less than 1 time in 5  7) How many times did you most typically get up to urinate from the time you went to bed until the time you got up in the morning?  0 - None  Total score:  0-7 mildly symptomatic   8-19 moderately symptomatic   20-35 severely symptomatic    LUTS: still present even with flomax, advised referral to Urologist and he agrees, he noticed an improvement of symptoms but AUA is worse . He was treated for prostatitis but still has symptoms.   Patient Active Problem List   Diagnosis Date Noted   Keratoconus of both eyes 02/10/2023   Gout 04/18/2017   Allergic  rhinitis, seasonal 02/09/2015   GAD (generalized anxiety disorder) 02/09/2015   Benign hypertension 02/09/2015   Blood glucose elevated 02/09/2015   Gastric reflux 02/09/2015   Obesity (BMI 30-39.9) 02/09/2015    Past Surgical History:  Procedure Laterality Date   COLONOSCOPY  12/04/2020   SHOULDER SURGERY Left    Rotator Cuff Repair   UPPER GASTROINTESTINAL ENDOSCOPY  12/04/2020   also has had in past in [place], 2007-2008    Family History  Problem Relation Age of Onset   Hypertension Mother    Dementia Mother    Lung cancer Mother        [name] through treatment   Pneumonia Mother        covid complications    Hypertension Father    Dementia Father    Hypertension Brother    Colon cancer Neg Hx    Esophageal cancer Neg Hx    Pancreatic cancer Neg Hx    Stomach cancer Neg Hx    Liver disease Neg Hx    Colon polyps Neg Hx    Rectal cancer Neg Hx     Social History    Tobacco Use   Smoking status: Never   Smokeless tobacco: Never  Substance Use Topics   Alcohol use: No    Alcohol/week: 0.0 standard drinks of alcohol     Current Outpatient Medications:    ALPRAZolam (XANAX) 0.5 MG tablet, Take 1 tablet (0.5 mg total) by mouth 2 (two) times daily as needed for anxiety., Disp: 30 tablet, Rfl: 0   amLODipine-valsartan (EXFORGE) 5-160 MG tablet, TAKE 1 TABLET BY MOUTH DAILY FOR BLOOD PRESSURE, Disp: 90 tablet, Rfl: 0   buPROPion (WELLBUTRIN XL) 150 MG 24 hr tablet, TAKE 1 TABLET (150 MG TOTAL) BY MOUTH EVERY MORNING. FOR DEPRESSION/ENERGY, Disp: 90 tablet, Rfl: 0   ciprofloxacin (CIPRO) 250 MG tablet, Take 1 tablet (250 mg total) by mouth 2 (two) times daily., Disp: 14 tablet, Rfl: 0   citalopram (CELEXA) 20 MG tablet, TAKE 1 TABLET BY MOUTH EVERY DAY, Disp: 90 tablet, Rfl: 1   hydrOXYzine (ATARAX) 10 MG tablet, TAKE 1 TABLET BY MOUTH EVERYDAY AT BEDTIME, Disp: 90 tablet, Rfl: 0   pantoprazole (PROTONIX) 40 MG tablet, TAKE 1 TABLET (40 MG TOTAL) BY MOUTH DAILY BEFORE SUPPER. FOR HEARTBURN, Disp: 90 tablet, Rfl: 0   tamsulosin (FLOMAX) 0.4 MG CAPS capsule, Take 1 capsule (0.4 mg total) by mouth daily., Disp: 30 capsule, Rfl: 0   colchicine 0.6 MG tablet, TAKE 1 TABLET BY MOUTH DAILY AS NEEDEDED.TAKE 2 AT ONSET OF GOUT AND REPEAT 1 TIME AN HOUR AS NEEDED (Patient not taking: Reported on 08/02/2023), Disp: 30 tablet, Rfl: 0  No Known Allergies  I personally reviewed active problem list, medication list, allergies, family history with the patient/caregiver today.   ROS  Ten systems reviewed and is negative except as mentioned in HPI    Objective  Vitals:   08/02/23 1456  BP: 128/84  Pulse: 92  Resp: 16  SpO2: 98%  Weight: 221 lb (100.2 kg)  Height: 5\' 9"  (1.753 m)    Body mass index is 32.64 kg/m.  Physical Exam  Constitutional: Patient appears well-developed and well-nourished. Obese = No distress.  HEENT: head atraumatic, normocephalic,  pupils equal and reactive to light, neck supple Cardiovascular: Normal rate, regular rhythm and normal heart sounds.  No murmur heard. No BLE edema. Pulmonary/Chest: Effort normal and breath sounds normal. No respiratory distress. Abdominal: Soft.  There is no tenderness. Psychiatric:  Patient has a normal mood and affect. behavior is normal. Judgment and thought content normal.   Recent Results (from the past 2160 hours)  PSA     Status: Abnormal   Collection Time: 07/09/23  3:12 PM  Result Value Ref Range   PSA 30.05 (H) < OR = 4.00 ng/mL    Comment: The total PSA value from this assay system is  standardized against the WHO standard. The test  result will be approximately 20% lower when compared  to the equimolar-standardized total PSA (Beckman  Coulter). Comparison of serial PSA results should be  interpreted with this fact in mind. . This test was performed using the Siemens  chemiluminescent method. Values obtained from  different assay methods cannot be used interchangeably. PSA levels, regardless of value, should not be interpreted as absolute evidence of the presence or absence of disease.   CULTURE, URINE COMPREHENSIVE     Status: None   Collection Time: 07/09/23  3:12 PM   Specimen: Urine  Result Value Ref Range   MICRO NUMBER: [id number]    SPECIMEN QUALITY: Adequate    Source OTHER (SPECIFY)    STATUS: FINAL    RESULT: No Growth   Urinalysis, microscopic only     Status: Abnormal   Collection Time: 07/09/23  3:12 PM  Result Value Ref Range   WBC, UA 6-10 (A) 0 - 5 /HPF   RBC / HPF NONE SEEN 0 - 2 /HPF   Squamous Epithelial / HPF NONE SEEN < OR = 5 /HPF   Bacteria, UA NONE SEEN NONE SEEN /HPF   Hyaline Cast NONE SEEN NONE SEEN /LPF   Note      Comment: This urine was analyzed for the presence of WBC,  RBC, bacteria, casts, and other formed elements.  Only those elements seen were reported. . .     Diabetic Foot Exam:     PHQ2/9:    08/02/2023    2:56  PM 07/09/2023    2:41 PM 02/10/2023    3:17 PM 12/17/2022   10:33 AM 12/16/2022   10:19 AM  Depression screen PHQ 2/9  Decreased Interest 0 0 0 1 1  Down, Depressed, Hopeless 0 0 0 1 1  PHQ - 2 Score 0 0 0 2 2  Altered sleeping 0 0 0 1 1  Tired, decreased energy 0 0 0 1 1  Change in appetite 0 0 0 0 0  Feeling bad or failure about yourself  0 0 0 1 1  Trouble concentrating 0 0 0 0 0  Moving slowly or fidgety/restless 0 0 0 0 0  Suicidal thoughts 0 0 0 0 0  PHQ-9 Score 0 0 0 5 5  Difficult doing work/chores Not difficult at all Not difficult at all   Somewhat difficult    phq 9 is negative  Fall Risk:    07/09/2023    2:41 PM 02/10/2023    3:12 PM 12/17/2022   10:33 AM 12/16/2022   10:18 AM 04/14/2022    8:55 AM  Fall Risk   Falls in the past year? 0 0 0 0 1  Number falls in past yr: 0 0 0  0  Injury with Fall? 0 0 0  0  Risk for fall due to : No Fall Risks No Fall Risks No Fall Risks No Fall Risks No Fall Risks  Follow up Falls prevention discussed;Education provided;Falls evaluation completed Falls prevention discussed Falls prevention discussed Falls prevention discussed Falls prevention discussed  Assessment & Plan  1. Lower urinary tract symptoms (LUTS) (Primary)  - Ambulatory referral to Urology - tamsulosin (FLOMAX) 0.4 MG CAPS capsule; Take 1 capsule (0.4 mg total) by mouth daily.  Dispense: 30 capsule; Refill: 0  2. Benign hypertension  - amLODipine-valsartan (EXFORGE) 5-160 MG tablet; Take 1 tablet by mouth daily. For blood pressure  Dispense: 90 tablet; Refill: 1  3. GAD (generalized anxiety disorder)  - ALPRAZolam (XANAX) 0.5 MG tablet; Take 1 tablet (0.5 mg total) by mouth 2 (two) times daily as needed for anxiety.  Dispense: 30 tablet; Refill: 0 - buPROPion (WELLBUTRIN XL) 150 MG 24 hr tablet; Take 1 tablet (150 mg total) by mouth every morning. For depression/energy  Dispense: 90 tablet; Refill: 1  4. Dysthymia  - buPROPion (WELLBUTRIN XL) 150 MG 24 hr  tablet; Take 1 tablet (150 mg total) by mouth every morning. For depression/energy  Dispense: 90 tablet; Refill: 1  5. GERD without esophagitis  - pantoprazole (PROTONIX) 40 MG tablet; Take 1 tablet (40 mg total) by mouth daily before supper. For heartburn  Dispense: 90 tablet; Refill: 1  6. Dyslipidemia  Refuses statin therapy   7. Controlled gout  unchanged  8. Elevated PSA measurement  - Ambulatory referral to Urology  9. Panic attack  - ALPRAZolam (XANAX) 0.5 MG tablet; Take 1 tablet (0.5 mg total) by mouth 2 (two) times daily as needed for anxiety.  Dispense: 30 tablet; Refill: 0

## 2023-09-12 ENCOUNTER — Other Ambulatory Visit: Payer: Self-pay | Admitting: Family Medicine

## 2023-09-12 DIAGNOSIS — R399 Unspecified symptoms and signs involving the genitourinary system: Secondary | ICD-10-CM

## 2023-09-18 ENCOUNTER — Other Ambulatory Visit: Payer: Self-pay | Admitting: Family Medicine

## 2023-09-18 DIAGNOSIS — F341 Dysthymic disorder: Secondary | ICD-10-CM

## 2023-09-18 DIAGNOSIS — F411 Generalized anxiety disorder: Secondary | ICD-10-CM

## 2023-09-20 NOTE — Telephone Encounter (Signed)
 Requested Prescriptions  Pending Prescriptions Disp Refills   citalopram  (CELEXA ) 20 MG tablet [Pharmacy Med Name: CITALOPRAM  HBR 20 MG TABLET] 90 tablet 1    Sig: TAKE 1 TABLET BY MOUTH EVERY DAY     Psychiatry:  Antidepressants - SSRI Passed - 09/20/2023 11:43 AM      Passed - Valid encounter within last 6 months    Recent Outpatient Visits           1 month ago Lower urinary tract symptoms (LUTS)   [facility] Arleen Lacer, MD   2 months ago Urinary problem in male   [facility] Arleen Lacer, MD       Future Appointments             In 4 months Sowles, Krichna, MD [facility]             hydrOXYzine  (ATARAX ) 10 MG tablet [Pharmacy Med Name: HYDROXYZINE  HCL 10 MG TABLET] 90 tablet     Sig: TAKE 1 TABLET BY MOUTH EVERYDAY AT BEDTIME     Ear, Nose, and Throat:  Antihistamines 2 Passed - 09/20/2023 11:43 AM      Passed - Cr in normal range and within 360 days    Creat  Date Value Ref Range Status  04/14/2022 1.09 0.70 - 1.30 mg/dL Final   Creatinine, Ser  Date Value Ref Range Status  12/14/2022 1.17 0.61 - 1.24 mg/dL Final         Passed - Valid encounter within last 12 months    Recent Outpatient Visits           1 month ago Lower urinary tract symptoms (LUTS)   [facility] Arleen Lacer, MD   2 months ago Urinary problem in male   [facility] Arleen Lacer, MD       Future Appointments             In 4 months Ava Lei, Krichna, MD [facility], [facility]

## 2023-12-21 ENCOUNTER — Other Ambulatory Visit: Payer: Self-pay | Admitting: Family Medicine

## 2023-12-21 DIAGNOSIS — F411 Generalized anxiety disorder: Secondary | ICD-10-CM

## 2023-12-21 DIAGNOSIS — F341 Dysthymic disorder: Secondary | ICD-10-CM

## 2024-01-08 DIAGNOSIS — N401 Enlarged prostate with lower urinary tract symptoms: Secondary | ICD-10-CM | POA: Diagnosis not present

## 2024-01-08 DIAGNOSIS — N400 Enlarged prostate without lower urinary tract symptoms: Secondary | ICD-10-CM | POA: Diagnosis not present

## 2024-01-08 DIAGNOSIS — R000 Tachycardia, unspecified: Secondary | ICD-10-CM | POA: Diagnosis not present

## 2024-01-08 DIAGNOSIS — R339 Retention of urine, unspecified: Secondary | ICD-10-CM | POA: Diagnosis not present

## 2024-01-08 DIAGNOSIS — R338 Other retention of urine: Secondary | ICD-10-CM | POA: Diagnosis not present

## 2024-01-08 DIAGNOSIS — I10 Essential (primary) hypertension: Secondary | ICD-10-CM | POA: Diagnosis not present

## 2024-01-08 NOTE — ED Provider Notes (Signed)
 Emergency Department Provider Note    ED Clinical Impression   Final diagnoses:  Urinary retention due to benign prostatic hyperplasia (Primary)    ED Assessment/Plan   History of Present Illness Johnathan Eaton is a 53 year old male with urinary retention who presents with inability to urinate.  He last urinated at 9:30 AM, producing only a minimal dribble without a good stream. He has experienced similar episodes of ineffective bladder emptying in the past. He was previously prescribed Flomax  but is not currently taking it. He has never had a catheter and is experiencing discomfort due to the inability to urinate. There is no fever or other systemic symptoms. He is concerned about the possibility of bladder rupture due to retention.  Medical Decision Making A 53 year old male with a history of benign prostatic hyperplasia presented with acute urinary retention, reporting inability to void since 9:30 with only minimal dribbling. He has experienced similar symptoms in the past and was previously prescribed Flomax , which he is not currently taking. On exam, his bladder was significantly distended, and approximately 900 mL of urine was drained via catheterization. He denied prior catheterization and had no prior episodes of complete retention.  Acute urinary retention secondary to benign prostatic hyperplasia - Inserted urinary catheter and drained bladder - Will leavecatheter in place for one week - Ordered urinalysis to check for infection - Administered first dose of Flomax  - Instructed on use of leg bag for catheter - Arranged follow-up with urology for trial of void after catheter removal - Discharged home      Discussion of Management with other Physicians, QHP or Appropriate Source:  N/A Independent Interpretation of Studies: EKG  N/A; RAD  N/A, POCUS  N/A External Records Reviewed: Patient's most recent outpatient clinic note Escalation of Care, Consideration of  Admission/Observation/Transfer:  N/A Social determinants that significantly affected care: None applicable Prescription drug(s) considered but not prescribed:  Diagnostic tests considered but not performed:  History obtained from other sources: Family  Medical Decision Making       History   Chief Complaint  Patient presents with  . Difficulty Urinating   HPI History of Present Illness Johnathan Eaton is a 53 year old male with urinary retention who presents with inability to urinate.  He last urinated at 9:30 AM, producing only a minimal dribble without a good stream. He has experienced similar episodes of ineffective bladder emptying in the past. He was previously prescribed Flomax  but is not currently taking it. He has never had a catheter and is experiencing discomfort due to the inability to urinate. There is no fever or other systemic symptoms. He is concerned about the possibility of bladder rupture due to retention.   Past Medical History[1]  Past Surgical History[2]  Family History[3]  Social History[4]  Current Medications[5]  Review of Systems 10+ point review of systems negative except as detailed in HPI.    Physical Exam   BP 162/117   Pulse 112   Resp 24   Physical Exam Vitals and nursing note reviewed.  Constitutional:      Appearance: Normal appearance. He is normal weight.  HENT:     Head: Normocephalic and atraumatic.     Nose: Nose normal.     Mouth/Throat:     Mouth: Mucous membranes are moist.  Eyes:     Extraocular Movements: Extraocular movements intact.     Pupils: Pupils are equal, round, and reactive to light.  Cardiovascular:     Rate and  Rhythm: Tachycardia present.     Pulses: Normal pulses.  Pulmonary:     Effort: Pulmonary effort is normal. No respiratory distress.  Abdominal:     General: There is distension.     Comments: Suprapubic tenderness and distention noted  Musculoskeletal:        General: Normal range of  motion.     Cervical back: Normal range of motion and neck supple.  Skin:    General: Skin is warm and dry.     Capillary Refill: Capillary refill takes less than 2 seconds.  Neurological:     General: No focal deficit present.     Mental Status: He is alert and oriented to person, place, and time. Mental status is at baseline.  Psychiatric:        Mood and Affect: Mood normal.        Behavior: Behavior normal.        Thought Content: Thought content normal.        Judgment: Judgment normal.                  [1] Past Medical History: Diagnosis Date  . Hypertension   [2] Past Surgical History: Procedure Laterality Date  . SHOULDER SURGERY    [3] No family history on file. [4] Social History Socioeconomic History  . Marital status: Married  Tobacco Use  . Smoking status: Never  . Smokeless tobacco: Never  Substance and Sexual Activity  . Alcohol use: No   Social Drivers of Corporate investment banker Strain: Low Risk  (02/10/2023)   Received from [facility]   Overall Financial Resource Strain (CARDIA)   . Difficulty of Paying Living Expenses: Not hard at all  Food Insecurity: No Food Insecurity (02/10/2023)   Received from [facility]   Hunger Vital Sign   . Within the past 12 months, you worried that your food would run out before you got the money to buy more.: Never true   . Within the past 12 months, the food you bought just didn't last and you didn't have money to get more.: Never true  Transportation Needs: No Transportation Needs (02/10/2023)   Received from [facility] - Transportation   . Lack of Transportation (Medical): No   . Lack of Transportation (Non-Medical): No  Physical Activity: Sufficiently Active (11/25/2020)   Received from [facility]   Exercise Vital Sign   . On average, how many days per week do you engage in moderate to strenuous exercise (like a brisk walk)?: 5 days   . On average, how many minutes do you engage in  exercise at this level?: 30 min  Stress: No Stress Concern Present (02/10/2023)   Received from [facility] - Occupational Stress Questionnaire   . Feeling of Stress : Not at all  Social Connections: Moderately Integrated (02/10/2023)   Received from [facility]   Social Connection and Isolation Panel   . In a typical week, how many times do you talk on the phone with family, friends, or neighbors?: More than three times a week   . How often do you get together with friends or relatives?: Twice a week   . How often do you attend church or religious services?: More than 4 times per year   . Do you belong to any clubs or organizations such as church groups, unions, fraternal or athletic groups, or school groups?: No   . How  often do you attend meetings of the clubs or organizations you belong to?: Never   . Are you married, widowed, divorced, separated, never married, or living with a partner?: Married  [5] Current Facility-Administered Medications  Medication Dose Route Frequency Provider Last Rate Last Admin  . tamsulosin  (FLOMAX ) 24 hr capsule 0.4 mg  0.4 mg Oral Daily Saul Ezella Jansky, MD   0.4 mg at 01/08/24 9741   Current Outpatient Medications  Medication Sig Dispense Refill  . ALPRAZolam  (XANAX ) 0.5 MG tablet Take 0.5 mg by mouth.    . amLODIPine -valsartan  (EXFORGE ) 5-160 mg per tablet Take 1 tablet by mouth daily.    . colchicine  (COLCRYS ) 0.6 mg tablet Take 0.6 mg by mouth.    . EPINEPHrine (EPIPEN) 0.3 mg/0.3 mL injection Inject 0.3 mL (0.3 mg total) into the muscle once. for 1 dose 1 each 0  . famotidine (PEPCID) 20 MG tablet Take 1 tablet (20 mg total) by mouth Two (2) times a day. for 4 days 8 tablet 0  . hydrOXYzine  (ATARAX ) 10 MG tablet Take 10 mg by mouth.    . pantoprazole  (PROTONIX ) 20 MG tablet TAKE 1 TABLET (20 MG TOTAL) BY MOUTH DAILY BEFORE SUPPER.     Saul Ezella Jansky, MD 01/08/24 [id number]

## 2024-01-10 ENCOUNTER — Ambulatory Visit (INDEPENDENT_AMBULATORY_CARE_PROVIDER_SITE_OTHER): Admitting: Family Medicine

## 2024-01-10 ENCOUNTER — Encounter: Payer: Self-pay | Admitting: Family Medicine

## 2024-01-10 VITALS — BP 136/86 | HR 108 | Resp 16 | Ht 69.0 in | Wt 207.6 lb

## 2024-01-10 DIAGNOSIS — R338 Other retention of urine: Secondary | ICD-10-CM

## 2024-01-10 DIAGNOSIS — Z978 Presence of other specified devices: Secondary | ICD-10-CM | POA: Diagnosis not present

## 2024-01-10 DIAGNOSIS — R399 Unspecified symptoms and signs involving the genitourinary system: Secondary | ICD-10-CM | POA: Diagnosis not present

## 2024-01-10 DIAGNOSIS — N401 Enlarged prostate with lower urinary tract symptoms: Secondary | ICD-10-CM | POA: Diagnosis not present

## 2024-01-10 MED ORDER — TAMSULOSIN HCL 0.4 MG PO CAPS
0.4000 mg | ORAL_CAPSULE | Freq: Every day | ORAL | 0 refills | Status: DC
Start: 2024-01-10 — End: 2024-01-24

## 2024-01-10 NOTE — Progress Notes (Deleted)
 Name: Johnathan Eaton   MRN: [id number]    DOB: [birth year]   Date:01/10/2024       Progress Note  Subjective  Chief Complaint  Chief Complaint  Patient presents with   ER Follow up    Pt was not able to get FLOMAX  that was given at the ER. Pt still has not heard from Urology yet   {HPI:32069} *** Patient Active Problem List   Diagnosis Date Noted   Keratoconus of both eyes 02/10/2023   Gout 04/18/2017   Allergic rhinitis, seasonal 02/09/2015   GAD (generalized anxiety disorder) 02/09/2015   Benign hypertension 02/09/2015   Blood glucose elevated 02/09/2015   Gastric reflux 02/09/2015   Obesity (BMI 30-39.9) 02/09/2015    Past Surgical History:  Procedure Laterality Date   COLONOSCOPY  12/04/2020   SHOULDER SURGERY Left    Rotator Cuff Repair   UPPER GASTROINTESTINAL ENDOSCOPY  12/04/2020   also has had in past in [place], 2007[place]2008    Family History  Problem Relation Age of Onset   Hypertension Mother    Dementia Mother    Lung cancer Mother        [place] through treatment   Pneumonia Mother        covid complications    Hypertension Father    Dementia Father    Hypertension Brother    Colon cancer Neg Hx    Esophageal cancer Neg Hx    Pancreatic cancer Neg Hx    Stomach cancer Neg Hx    Liver disease Neg Hx    Colon polyps Neg Hx    Rectal cancer Neg Hx     Social History   Tobacco Use   Smoking status: Never   Smokeless tobacco: Never  Substance Use Topics   Alcohol use: No    Alcohol/week: 0.0 standard drinks of alcohol     Current Outpatient Medications:    ALPRAZolam  (XANAX ) 0.5 MG tablet, Take 1 tablet (0.5 mg total) by mouth 2 (two) times daily as needed for anxiety., Disp: 30 tablet, Rfl: 0   amLODipine -valsartan  (EXFORGE ) 5-160 MG tablet, Take 1 tablet by mouth daily. For blood pressure, Disp: 90 tablet, Rfl: 1   buPROPion  (WELLBUTRIN  XL) 150 MG 24 hr tablet, Take 1 tablet (150 mg total) by mouth every morning. For depression/energy,  Disp: 90 tablet, Rfl: 1   citalopram  (CELEXA ) 20 MG tablet, TAKE 1 TABLET BY MOUTH EVERY DAY, Disp: 90 tablet, Rfl: 0   colchicine  0.6 MG tablet, TAKE 1 TABLET BY MOUTH DAILY AS NEEDEDED.TAKE 2 AT ONSET OF GOUT AND REPEAT 1 TIME AN HOUR AS NEEDED, Disp: 30 tablet, Rfl: 0   pantoprazole  (PROTONIX ) 40 MG tablet, Take 1 tablet (40 mg total) by mouth daily before supper. For heartburn, Disp: 90 tablet, Rfl: 1   tamsulosin  (FLOMAX ) 0.4 MG CAPS capsule, TAKE 1 CAPSULE BY MOUTH EVERY DAY (Patient not taking: Reported on 01/10/2024), Disp: 30 capsule, Rfl: 0  No Known Allergies  I personally reviewed active problem list, medication list, allergies with the patient/caregiver today.   ROS  Ten systems reviewed and is negative except as mentioned in HPI    Objective Physical Exam   Vitals:   01/10/24 1305  BP: (!) 148/90  Pulse: (!) 108  Resp: 16  SpO2: 99%  Weight: 207 lb 9.6 oz (94.2 kg)  Height: 5' 9 (1.753 m)    Body mass index is 30.66 kg/m.   PHQ2/9:    01/10/2024   12:57 PM  08/02/2023    2:56 PM 07/09/2023    2:41 PM 02/10/2023    3:17 PM 12/17/2022   10:33 AM  Depression screen PHQ 2/9  Decreased Interest 0 0 0 0 1  Down, Depressed, Hopeless 0 0 0 0 1  PHQ - 2 Score 0 0 0 0 2  Altered sleeping 0 0 0 0 1  Tired, decreased energy 0 0 0 0 1  Change in appetite 0 0 0 0 0  Feeling bad or failure about yourself  0 0 0 0 1  Trouble concentrating 0 0 0 0 0  Moving slowly or fidgety/restless 0 0 0 0 0  Suicidal thoughts 0 0 0 0 0  PHQ-9 Score 0 0 0 0 5  Difficult doing work/chores Not difficult at all Not difficult at all Not difficult at all      phq 9 is negative  Fall Risk:    01/10/2024   12:57 PM 07/09/2023    2:41 PM 02/10/2023    3:12 PM 12/17/2022   10:33 AM 12/16/2022   10:18 AM  Fall Risk   Falls in the past year? 0 0 0 0 0  Number falls in past yr: 0 0 0 0   Injury with Fall? 0 0 0 0   Risk for fall due to : No Fall Risks No Fall Risks No Fall Risks No Fall  Risks No Fall Risks  Follow up Falls evaluation completed Falls prevention discussed;Education provided;Falls evaluation completed Falls prevention discussed Falls prevention discussed Falls prevention discussed     {A&P:32071}

## 2024-01-10 NOTE — Progress Notes (Signed)
 Name: Johnathan Eaton   MRN: [id number]    DOB: 09-24-1970   Date:01/10/2024       Progress Note  Subjective  Chief Complaint  Chief Complaint  Patient presents with   ER Follow up    Pt was not able to get FLOMAX  that was given at the ER. Pt still has not heard from Urology yet   Discussed the use of AI scribe software for clinical note transcription with the patient, who gave verbal consent to proceed.  History of Present Illness Johnathan Eaton is a 53 year old male who presents with urinary retention and discomfort from a Foley catheter.  He has been experiencing urinary retention and discomfort from a Foley catheter, which began over the weekend and worsened until he sought emergency care on August 9th. At the emergency center, significant urinary retention was identified, and a Foley catheter was placed.  The catheter causes discomfort and pain, especially when moving or sitting. Initially, the urine was clear, but he has since noticed hematuria, which he attributes to catheter movement. He adjusted the catheter himself to reduce tension, believing improper positioning contributed to the bleeding.  He was prescribed Flomax  for urinary symptoms, but due to a pharmacy error, he has not obtained it. A 90-day supply was intended to be mailed, but it was not available for immediate pickup.  His blood pressure was elevated during this visit. He took his blood pressure medication, amlodipine  valsartan  5/160 mg, about an hour before the visit. No palpitations, but he feels nervous.  He has been given time off work until next Monday due to his condition and is concerned about needing more time off if symptoms persist. The catheter limits his ability to move and perform daily activities.    Patient Active Problem List   Diagnosis Date Noted   Keratoconus of both eyes 02/10/2023   Gout 04/18/2017   Allergic rhinitis, seasonal 02/09/2015   GAD (generalized anxiety disorder) 02/09/2015    Benign hypertension 02/09/2015   Blood glucose elevated 02/09/2015   Gastric reflux 02/09/2015   Obesity (BMI 30-39.9) 02/09/2015    Past Surgical History:  Procedure Laterality Date   COLONOSCOPY  12/04/2020   SHOULDER SURGERY Left    Rotator Cuff Repair   UPPER GASTROINTESTINAL ENDOSCOPY  12/04/2020   also has had in past in [place], 2007-2008    Family History  Problem Relation Age of Onset   Hypertension Mother    Dementia Mother    Lung cancer Mother        Santina through treatment   Pneumonia Mother        covid complications    Hypertension Father    Dementia Father    Hypertension Brother    Colon cancer Neg Hx    Esophageal cancer Neg Hx    Pancreatic cancer Neg Hx    Stomach cancer Neg Hx    Liver disease Neg Hx    Colon polyps Neg Hx    Rectal cancer Neg Hx     Social History   Tobacco Use   Smoking status: Never   Smokeless tobacco: Never  Substance Use Topics   Alcohol use: No    Alcohol/week: 0.0 standard drinks of alcohol     Current Outpatient Medications:    ALPRAZolam  (XANAX ) 0.5 MG tablet, Take 1 tablet (0.5 mg total) by mouth 2 (two) times daily as needed for anxiety., Disp: 30 tablet, Rfl: 0   amLODipine -valsartan  (EXFORGE ) 5-160 MG tablet, Take  1 tablet by mouth daily. For blood pressure, Disp: 90 tablet, Rfl: 1   buPROPion  (WELLBUTRIN  XL) 150 MG 24 hr tablet, Take 1 tablet (150 mg total) by mouth every morning. For depression/energy, Disp: 90 tablet, Rfl: 1   citalopram  (CELEXA ) 20 MG tablet, TAKE 1 TABLET BY MOUTH EVERY DAY, Disp: 90 tablet, Rfl: 0   colchicine  0.6 MG tablet, TAKE 1 TABLET BY MOUTH DAILY AS NEEDEDED.TAKE 2 AT ONSET OF GOUT AND REPEAT 1 TIME AN HOUR AS NEEDED, Disp: 30 tablet, Rfl: 0   pantoprazole  (PROTONIX ) 40 MG tablet, Take 1 tablet (40 mg total) by mouth daily before supper. For heartburn, Disp: 90 tablet, Rfl: 1   tamsulosin  (FLOMAX ) 0.4 MG CAPS capsule, Take 1 capsule (0.4 mg total) by mouth daily., Disp: 30  capsule, Rfl: 0  No Known Allergies  I personally reviewed active problem list, medication list, allergies with the patient/caregiver today.   ROS  Ten systems reviewed and is negative except as mentioned in HPI    Objective Physical Exam  CONSTITUTIONAL: Patient appears well-developed and well-nourished. No distress. HEENT: Head atraumatic, normocephalic, neck supple. CARDIOVASCULAR: Normal rate, regular rhythm and normal heart sounds. No murmur heard. No BLE edema. PULMONARY: Effort normal and breath sounds normal. Lungs clear to auscultation. No respiratory distress. ABDOMINAL: There is mild discomfort  PSYCHIATRIC: Patient has a normal mood and affect. Behavior is normal. Judgment and thought content normal.  Vitals:   01/10/24 1305  BP: (!) 148/90  Pulse: (!) 108  Resp: 16  SpO2: 99%  Weight: 207 lb 9.6 oz (94.2 kg)  Height: 5' 9 (1.753 m)    Body mass index is 30.66 kg/m.   PHQ2/9:    01/10/2024   12:57 PM 08/02/2023    2:56 PM 07/09/2023    2:41 PM 02/10/2023    3:17 PM 12/17/2022   10:33 AM  Depression screen PHQ 2/9  Decreased Interest 0 0 0 0 1  Down, Depressed, Hopeless 0 0 0 0 1  PHQ - 2 Score 0 0 0 0 2  Altered sleeping 0 0 0 0 1  Tired, decreased energy 0 0 0 0 1  Change in appetite 0 0 0 0 0  Feeling bad or failure about yourself  0 0 0 0 1  Trouble concentrating 0 0 0 0 0  Moving slowly or fidgety/restless 0 0 0 0 0  Suicidal thoughts 0 0 0 0 0  PHQ-9 Score 0 0 0 0 5  Difficult doing work/chores Not difficult at all Not difficult at all Not difficult at all      phq 9 is negative  Fall Risk:    01/10/2024   12:57 PM 07/09/2023    2:41 PM 02/10/2023    3:12 PM 12/17/2022   10:33 AM 12/16/2022   10:18 AM  Fall Risk   Falls in the past year? 0 0 0 0 0  Number falls in past yr: 0 0 0 0   Injury with Fall? 0 0 0 0   Risk for fall due to : No Fall Risks No Fall Risks No Fall Risks No Fall Risks No Fall Risks  Follow up Falls evaluation completed  Falls prevention discussed;Education provided;Falls evaluation completed Falls prevention discussed Falls prevention discussed Falls prevention discussed     Assessment & Plan Urinary retention with indwelling Foley catheter and hematuria secondary to benign prostatic hyperplasia Urinary retention due to benign prostatic hyperplasia required Foley catheter placement. Hematuria likely from catheter irritation. Previous  urology referral not followed up. - Send message to urology coordinator to expedite urologist appointment. Patient will be seen by Dr. Francisca tomorrow morning in [facility] - Advise on using GoodRx for Flomax  if insurance issues persist. - Ensure proper catheter positioning to minimize discomfort and hematuria. - Discuss potential need for further treatment to address prostate enlargement.  Hypertension Blood pressure elevated, likely due to pain and discomfort from urinary retention and catheter. Previous readings normal. Heart rate elevated, possibly due to anxiety and discomfort. - Continue current antihypertensive medication (amlodipine  valsartan  5/160 mg).

## 2024-01-11 ENCOUNTER — Ambulatory Visit (INDEPENDENT_AMBULATORY_CARE_PROVIDER_SITE_OTHER): Admitting: Urology

## 2024-01-11 ENCOUNTER — Encounter: Payer: Self-pay | Admitting: Urology

## 2024-01-11 VITALS — BP 146/92 | HR 101 | Ht 69.0 in | Wt 207.0 lb

## 2024-01-11 DIAGNOSIS — R399 Unspecified symptoms and signs involving the genitourinary system: Secondary | ICD-10-CM

## 2024-01-11 DIAGNOSIS — R972 Elevated prostate specific antigen [PSA]: Secondary | ICD-10-CM | POA: Diagnosis not present

## 2024-01-11 DIAGNOSIS — R339 Retention of urine, unspecified: Secondary | ICD-10-CM

## 2024-01-11 NOTE — Patient Instructions (Signed)
 Take flomax  twice daily starting this morning.  Holmium Laser Enucleation of the Prostate (HoLEP)  HoLEP is a treatment for men with benign prostatic hyperplasia (BPH). The laser surgery removed blockages of urine flow, and is done without any incisions on the body.     What is HoLEP?  HoLEP is a type of laser surgery used to treat obstruction (blockage) of urine flow as a result of benign prostatic hyperplasia (BPH). In men with BPH, the prostate gland is not cancerous, but has become enlarged. An enlarged prostate can result in a number of urinary tract symptoms such as weak urinary stream, difficulty in starting urination, inability to urinate, frequent urination, or getting up at night to urinate.  HoLEP was developed in the 1990's as a more effective and less expensive surgical option for BPH, compared to other surgical options such as laser vaporization(PVP/greenlight laser), transurethral resection of the prostate(TURP), and open simple prostatectomy.   What happens during a HoLEP?  HoLEP requires general anesthesia ("asleep" throughout the procedure).   An antibiotic is given to reduce the risk of infection  A surgical instrument called a resectoscope is inserted through the urethra (the tube that carries urine from the bladder). The resectoscope has a camera that allows the surgeon to view the internal structure of the prostate gland, and to see where the incisions are being made during surgery.  The laser is inserted into the resectoscope and is used to enucleate (free up) the enlarged prostate tissue from the capsule (outer shell) and then to seal up any blood vessels. The tissue that has been removed is pushed back into the bladder.  A morcellator is placed through the resectoscope, and is used to suction out the prostate tissue that has been pushed into the bladder.  When the prostate tissue has been removed, the resectoscope is removed, and a foley catheter is placed to allow  healing and drain the urine from the bladder.     What happens after a HoLEP?  More than 95% of patients go home the same day a few hours after surgery. Less than 5% will be admitted to the hospital overnight for observation to monitor the urine, or if they have other medical problems.  Fluid is flushed through the catheter for about 1 hour after surgery to clear any blood from the urine. It is normal to have some blood in the urine after surgery. The need for blood transfusion is extremely rare.  Eating and drinking are permitted after the procedure once the patient has fully awakened from anesthesia.  The catheter is usually removed 2-3 days after surgery- the patient will come to clinic to have the catheter removed and make sure they can urinate on their own.  It is very important to drink lots of fluids after surgery for one week to keep the bladder flushed.  At first, there may be some burning with urination, but this typically improved within a few hours to days. Most patients do not have a significant amount of pain, and narcotic pain medications are rarely needed.  Symptoms of urinary frequency, urgency, and even leakage are NORMAL for the first few weeks after surgery as the bladder adjusts after having to work hard against blockage from the prostate for many years. This will improve, but can sometimes take several months.  The use of pelvic floor exercises (Kegel exercises) can help improve problems with urinary incontinence.   After catheter removal, patients will be seen at 12 weeks and 6 months  for symptom check  No heavy lifting for at least 2-3 weeks after surgery, however patients can walk and do light activities the first day after surgery. Return to work time depends on occupation.    What are the advantages of HoLEP?  HoLEP has been studied in many different parts of the world and has been shown to be a safe and effective procedure. Although there are many types of  BPH surgeries available, HoLEP offers a unique advantage in being able to remove a large amount of tissue without any incisions on the body, even in very large prostates, while decreasing the risk of bleeding and providing tissue for pathology (to look for cancer). This decreases the need for blood transfusions during surgery, minimizes hospital stay, and reduces the risk of needing repeat treatment.  What are the side effects of HoLEP?  Temporary burning and bleeding during urination. Some blood may be seen in the urine for weeks after surgery and is part of the healing process.  Urinary incontinence (inability to control urine flow) is expected in all patients immediately after surgery and they should wear pads for the first few days/weeks. This typically improves over the course of several weeks. Performing Kegel exercises can help decrease leakage from stress maneuvers such as coughing, sneezing, or lifting. The rate of long term leakage is very low, 1-2%. Patients may also have leakage with urgency and this may be treated with medication. The risk of urge incontinence can be dependent on several factors including age, prostate size, symptoms, and other medical problems.  Retrograde ejaculation or "backwards ejaculation." In 80% of cases, the patient will not see any fluid during ejaculation after surgery.  Erectile function is generally not significantly affected.   What are the risks of HoLEP?  Injury to the urethra or development of scar tissue at a later date  Injury to the capsule of the prostate (typically treated with longer catheterization).  Injury to the bladder or ureteral orifices (where the urine from the kidney drains out)  Infection of the bladder, testes, or kidneys (~4%)  Return of urinary obstruction at a later date requiring another operation (<2%)  Need for blood transfusion or re-operation due to bleeding  Failure to relieve all symptoms and/or need for prolonged  catheterization after surgery  5-15% of patients are found to have previously undiagnosed prostate cancer in their specimen. Prostate cancer can be treated after HoLEP.  Standard risks of anesthesia including blood clots, heart attacks, etc ~1-2% risk of long term urinary incontinence (leakage)  When should I call my doctor?  Fever over 101.3 degrees  Inability to urinate, or large blood clots in the urine
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
## 2024-01-12 ENCOUNTER — Ambulatory Visit (INDEPENDENT_AMBULATORY_CARE_PROVIDER_SITE_OTHER): Admitting: Urology

## 2024-01-12 ENCOUNTER — Ambulatory Visit: Admitting: Urology

## 2024-01-12 DIAGNOSIS — R972 Elevated prostate specific antigen [PSA]: Secondary | ICD-10-CM

## 2024-01-12 DIAGNOSIS — Z466 Encounter for fitting and adjustment of urinary device: Secondary | ICD-10-CM

## 2024-01-12 DIAGNOSIS — R339 Retention of urine, unspecified: Secondary | ICD-10-CM

## 2024-01-12 DIAGNOSIS — R1033 Periumbilical pain: Secondary | ICD-10-CM

## 2024-01-12 MED ORDER — SULFAMETHOXAZOLE-TRIMETHOPRIM 800-160 MG PO TABS
1.0000 | ORAL_TABLET | Freq: Once | ORAL | Status: AC
Start: 2024-01-12 — End: 2024-01-12
  Administered 2024-01-12 (×2): 1 via ORAL

## 2024-01-12 NOTE — Progress Notes (Signed)
   01/12/2024 8:09 AM   Johnathan Johnathan Eaton, Johnathan Eaton [id number]  Reason for visit: Follow up urinary retention, elevated PSA  HPI: 53 year old male who developed urinary retention and was seen in the ER 01/08/2024, Foley catheter placed with return of 900 mL.  No imaging was performed.  He only started Flomax  on 01/10/24, and opted to wait an additional 24 hours prior to void trial for an additional 3 doses of Flomax (taking twice daily).  He also had an elevated PSA of 30 in February 2025, only prior PSA value was 0.4 from 2011.  He was told that he had prostatitis at that time by his PCP, but urine culture was negative, he thinks he may have improved after he was given antibiotics, but unsure.  In terms of his significantly elevated PSA of 30 from February 2025, we discussed possible etiologies including BPH/retention, acute prostatitis, or prostate cancer.  We discussed the challenges of interpreting the PSA in the setting of retention/catheter placement/infection.  We reviewed options including repeat PSA in 4 to 6 weeks, prostate biopsy, prostate MRI/CT, or proceeding directly to an outlet procedure.  His Foley was removed today, antibiotics given for prophylaxis, he was unable to void and return to clinic this afternoon with PVR and Foley was replaced.  See note by Lucie Hones, PA.  We again had a very long conversation today with the patient and his wife about options.  We discussed the importance of determining if this is BPH or prostate cancer, as well as the challenges of interpreting PSA in the setting of infection and catheterizations.  He would like to avoid prostate biopsy, had significant pain with catheter placement.  Ultimately, he opted for a CT abdomen and pelvis for further evaluation, this will give us  a prostate volume, as well as evaluate for any evidence of prostate cancer that would warrant further evaluation with biopsy or lymphadenopathy.  If CT benign aside  from enlarged prostate can proceed to [facility], they understand possible risk of additional pathology that may warrant additional treatment in the future.  If CT concerning will need biopsy prior to considering any additional treatment.  Continue Flomax , continue Foley catheter CT abdomen and pelvis, call with those results Consider HOLEP pending CT findings  I spent 45 total minutes on the day of the encounter including pre-visit review of the medical record, face-to-face time with the patient, and post visit ordering of labs/imaging/tests.   Redell JAYSON Burnet, MD  [facility] [address], [address] [place], [place] [address] [phone]

## 2024-01-12 NOTE — Progress Notes (Signed)
 Catheter Removal  Patient is present today for a catheter removal. 9ml of water was drained from the balloon. A 16FR foley cath was removed from the bladder, no complications were noted. Patient tolerated well. Patient given 1 time dose of Bactrim  per verbal order from Dr. Francisca.   Performed by: Aleister Lady, CMA (AAMA)   Follow up/ Additional notes: RTC this afternoon for PVR

## 2024-01-12 NOTE — Progress Notes (Signed)
 Simple Catheter Placement  Due to urinary retention patient is present today for a foley cath placement.  Patient was cleaned and prepped in a sterile fashion with betadine and 2% lidocaine jelly was instilled into the urethra. A 16 FR coude foley catheter was inserted, urine return was noted  , urine was orange in color.  The balloon was filled with 10cc of sterile water.  A leg bag was attached for drainage. Patient tolerated well, no complications were noted   Performed by: Riata Ikeda, PA-C

## 2024-01-12 NOTE — Patient Instructions (Addendum)
 Scheduling number [phone] Continue flomax , if persistent spasm let us  know and we can try oxybutynin   You have urinary retention likely from an enlarged prostate.  We have to determine if this is from prostate cancer or just BPH(benign enlargement), as these things are treated very differently.  We will order a CT scan to evaluate for any evidence of prostate cancer, as well as the size of your prostate.  Depending on those results, if there is no evidence of prostate cancer we can move forward with HOLEP to fix the blockage and get you back to urinating normally.  If there is concern for prostate cancer, you likely would need a biopsy prior to undergoing any procedure, as the surgery for prostate cancer is very different.   Holmium Laser Enucleation of the Prostate (HoLEP)  HoLEP is a treatment for men with benign prostatic hyperplasia (BPH). The laser surgery removed blockages of urine flow, and is done without any incisions on the body.     What is HoLEP?  HoLEP is a type of laser surgery used to treat obstruction (blockage) of urine flow as a result of benign prostatic hyperplasia (BPH). In men with BPH, the prostate gland is not cancerous, but has become enlarged. An enlarged prostate can result in a number of urinary tract symptoms such as weak urinary stream, difficulty in starting urination, inability to urinate, frequent urination, or getting up at night to urinate.  HoLEP was developed in the 1990's as a more effective and less expensive surgical option for BPH, compared to other surgical options such as laser vaporization(PVP/greenlight laser), transurethral resection of the prostate(TURP), and open simple prostatectomy.   What happens during a HoLEP?  HoLEP requires general anesthesia ("asleep" throughout the procedure).   An antibiotic is given to reduce the risk of infection  A surgical instrument called a resectoscope is inserted through the urethra (the tube that  carries urine from the bladder). The resectoscope has a camera that allows the surgeon to view the internal structure of the prostate gland, and to see where the incisions are being made during surgery.  The laser is inserted into the resectoscope and is used to enucleate (free up) the enlarged prostate tissue from the capsule (outer shell) and then to seal up any blood vessels. The tissue that has been removed is pushed back into the bladder.  A morcellator is placed through the resectoscope, and is used to suction out the prostate tissue that has been pushed into the bladder.  When the prostate tissue has been removed, the resectoscope is removed, and a foley catheter is placed to allow healing and drain the urine from the bladder.     What happens after a HoLEP?  More than 95% of patients go home the same day a few hours after surgery. Less than 5% will be admitted to the hospital overnight for observation to monitor the urine, or if they have other medical problems.  Fluid is flushed through the catheter for about 1 hour after surgery to clear any blood from the urine. It is normal to have some blood in the urine after surgery. The need for blood transfusion is extremely rare.  Eating and drinking are permitted after the procedure once the patient has fully awakened from anesthesia.  The catheter is usually removed 2-3 days after surgery- the patient will come to clinic to have the catheter removed and make sure they can urinate on their own.  It is very important to drink lots  of fluids after surgery for one week to keep the bladder flushed.  At first, there may be some burning with urination, but this typically improved within a few hours to days. Most patients do not have a significant amount of pain, and narcotic pain medications are rarely needed.  Symptoms of urinary frequency, urgency, and even leakage are NORMAL for the first few weeks after surgery as the bladder adjusts after  having to work hard against blockage from the prostate for many years. This will improve, but can sometimes take several months.  The use of pelvic floor exercises (Kegel exercises) can help improve problems with urinary incontinence.   After catheter removal, patients will be seen at 12 weeks and 6 months for symptom check  No heavy lifting for at least 2-3 weeks after surgery, however patients can walk and do light activities the first day after surgery. Return to work time depends on occupation.    What are the advantages of HoLEP?  HoLEP has been studied in many different parts of the world and has been shown to be a safe and effective procedure. Although there are many types of BPH surgeries available, HoLEP offers a unique advantage in being able to remove a large amount of tissue without any incisions on the body, even in very large prostates, while decreasing the risk of bleeding and providing tissue for pathology (to look for cancer). This decreases the need for blood transfusions during surgery, minimizes hospital stay, and reduces the risk of needing repeat treatment.  What are the side effects of HoLEP?  Temporary burning and bleeding during urination. Some blood may be seen in the urine for weeks after surgery and is part of the healing process.  Urinary incontinence (inability to control urine flow) is expected in all patients immediately after surgery and they should wear pads for the first few days/weeks. This typically improves over the course of several weeks. Performing Kegel exercises can help decrease leakage from stress maneuvers such as coughing, sneezing, or lifting. The rate of long term leakage is very low, 1-2%. Patients may also have leakage with urgency and this may be treated with medication. The risk of urge incontinence can be dependent on several factors including age, prostate size, symptoms, and other medical problems.  Retrograde ejaculation or "backwards  ejaculation." In 80% of cases, the patient will not see any fluid during ejaculation after surgery.  Erectile function is generally not significantly affected.   What are the risks of HoLEP?  Injury to the urethra or development of scar tissue at a later date  Injury to the capsule of the prostate (typically treated with longer catheterization).  Injury to the bladder or ureteral orifices (where the urine from the kidney drains out)  Infection of the bladder, testes, or kidneys (~4%)  Return of urinary obstruction at a later date requiring another operation (<2%)  Need for blood transfusion or re-operation due to bleeding  Failure to relieve all symptoms and/or need for prolonged catheterization after surgery  5-15% of patients are found to have previously undiagnosed prostate cancer in their specimen. Prostate cancer can be treated after HoLEP.  Standard risks of anesthesia including blood clots, heart attacks, etc ~1-2% risk of long term urinary incontinence (leakage)  When should I call my doctor?  Fever over 101.3 degrees  Inability to urinate, or large blood clots in the urine

## 2024-01-14 ENCOUNTER — Ambulatory Visit
Admission: RE | Admit: 2024-01-14 | Discharge: 2024-01-14 | Disposition: A | Discharge status: Home / Self Care | Source: Ambulatory Visit | Attending: Urology | Admitting: Urology

## 2024-01-14 ENCOUNTER — Other Ambulatory Visit: Payer: Self-pay | Admitting: Urology

## 2024-01-14 ENCOUNTER — Ambulatory Visit: Payer: Self-pay | Admitting: Urology

## 2024-01-14 DIAGNOSIS — N401 Enlarged prostate with lower urinary tract symptoms: Secondary | ICD-10-CM

## 2024-01-14 DIAGNOSIS — R1033 Periumbilical pain: Secondary | ICD-10-CM | POA: Diagnosis not present

## 2024-01-14 MED ORDER — IOHEXOL 300 MG/ML  SOLN
100.0000 mL | Freq: Once | INTRAMUSCULAR | Status: AC | PRN
  Administered 2024-01-14: 100 mL via INTRAVENOUS

## 2024-01-17 ENCOUNTER — Other Ambulatory Visit: Payer: Self-pay

## 2024-01-17 DIAGNOSIS — N138 Other obstructive and reflux uropathy: Secondary | ICD-10-CM

## 2024-01-17 NOTE — Progress Notes (Unsigned)
 Surgical Physician Order Form [facility] [place]  Dr. Francisca, MD  * Scheduling expectation : Next Available  *Length of Case: 1.5 hours  *Clearance needed: no  *Anticoagulation Instructions: Hold all anticoagulants  *Aspirin Instructions: Hold Aspirin  *Post-op visit Date/Instructions:  1-3 day cath removal  *Diagnosis: BPH w/urinary obstruction  *Procedure:  HOLEP (47350)   Additional orders: N/A  -Admit type: OUTpatient  -Anesthesia: General  -VTE Prophylaxis Standing Order SCD's       Other:   -Standing Lab Orders Per Anesthesia    Lab other: UA&Urine Culture  -Standing Test orders EKG/Chest x-ray per Anesthesia       Test other:   - Medications:  Ancef 2gm IV  -Other orders:  N/A

## 2024-01-18 ENCOUNTER — Telehealth: Payer: Self-pay

## 2024-01-18 NOTE — Telephone Encounter (Signed)
 Per Dr. Francisca, Patient is to be scheduled for Holmium Laser Enucleation of the Prostate   Mr. Espin was contacted and possible surgical dates were discussed, Friday August 29th, 2025 was agreed upon for surgery.   Patient was instructed that Dr. Francisca will require them to provide a pre-op UA & CX prior to surgery. This was ordered and scheduled drop off appointment was made for 01/19/2024.    Patient was directed to call [phone] between 1-3pm the day before surgery to find out surgical arrival time.  Instructions were given not to eat or drink from midnight on the night before surgery and have a driver for the day of surgery. On the surgery day patient was instructed to enter through the Medical Mall entrance of [facility] report the Same Day Surgery desk.   Pre-Admit Testing will be in contact via phone to set up an interview with the anesthesia team to review your history and medications prior to surgery.   Reminder of this information was given to the patient.

## 2024-01-18 NOTE — Progress Notes (Signed)
   [facility] Surgical Posting Form  Surgery Date: Date: 01/28/2024  Surgeon: Dr. Redell Burnet, MD  Inpt ( No  )   Outpt (Yes)   Obs ( No  )   Diagnosis: N40.1, N13.8 Benign Prostatic Hyperplasia with Urinary Obstruction  -CPT: [phone]  Surgery: Holmium Laser Enucleation of the Prostate  Stop Anticoagulations: Yes and also hold ASA  Cardiac/Medical/Pulmonary Clearance needed: no  *Orders entered into EPIC  Date: 01/18/24   *Case booked in [place]  Date: 01/17/2024  *Notified pt of Surgery: Date: 01/17/2024  PRE-OP UA & CX: Yes, will obtain in clinic on 01/19/2024  *Placed into Prior Authorization Work [place] Date: 01/18/24  Assistant/laser/rep:No

## 2024-01-19 ENCOUNTER — Encounter: Payer: Self-pay | Admitting: Physician Assistant

## 2024-01-19 ENCOUNTER — Other Ambulatory Visit

## 2024-01-19 ENCOUNTER — Ambulatory Visit (INDEPENDENT_AMBULATORY_CARE_PROVIDER_SITE_OTHER): Admitting: Physician Assistant

## 2024-01-19 VITALS — BP 137/88 | HR 70 | Ht 69.0 in | Wt 212.0 lb

## 2024-01-19 DIAGNOSIS — N401 Enlarged prostate with lower urinary tract symptoms: Secondary | ICD-10-CM

## 2024-01-19 DIAGNOSIS — R338 Other retention of urine: Secondary | ICD-10-CM

## 2024-01-19 DIAGNOSIS — Z01818 Encounter for other preprocedural examination: Secondary | ICD-10-CM | POA: Diagnosis not present

## 2024-01-19 LAB — URINALYSIS, COMPLETE
Bilirubin, UA: NEGATIVE
Glucose, UA: NEGATIVE
Ketones, UA: NEGATIVE
Nitrite, UA: POSITIVE — AB
Specific Gravity, UA: 1.025 (ref 1.005–1.030)
Urobilinogen, Ur: 1 mg/dL (ref 0.2–1.0)
pH, UA: 6 (ref 5.0–7.5)

## 2024-01-19 LAB — MICROSCOPIC EXAMINATION: WBC, UA: >30 /HPF — AB (ref 0–5)

## 2024-01-19 NOTE — Progress Notes (Signed)
 Catheter plugged and urine sample obtained for preop UA/culture.  Leg bag exchanged.  Patient tolerated well.

## 2024-01-24 ENCOUNTER — Encounter
Admission: RE | Admit: 2024-01-24 | Discharge: 2024-01-24 | Disposition: A | Discharge status: Home / Self Care | Source: Ambulatory Visit | Attending: Urology | Admitting: Urology

## 2024-01-24 ENCOUNTER — Other Ambulatory Visit: Payer: Self-pay

## 2024-01-24 DIAGNOSIS — Z01810 Encounter for preprocedural cardiovascular examination: Secondary | ICD-10-CM

## 2024-01-24 DIAGNOSIS — N401 Enlarged prostate with lower urinary tract symptoms: Secondary | ICD-10-CM

## 2024-01-24 DIAGNOSIS — Z01812 Encounter for preprocedural laboratory examination: Secondary | ICD-10-CM

## 2024-01-24 DIAGNOSIS — I10 Essential (primary) hypertension: Secondary | ICD-10-CM

## 2024-01-24 HISTORY — DX: Unspecified asthma, uncomplicated: J45.909

## 2024-01-24 HISTORY — DX: Depression, unspecified: F32.A

## 2024-01-24 HISTORY — DX: Benign prostatic hyperplasia with lower urinary tract symptoms: N13.8

## 2024-01-24 HISTORY — DX: Benign prostatic hyperplasia with lower urinary tract symptoms: N40.1

## 2024-01-24 HISTORY — DX: Hyperlipidemia, unspecified: E78.5

## 2024-01-24 HISTORY — DX: Elevated prostate specific antigen (PSA): R97.20

## 2024-01-24 LAB — CULTURE, URINE COMPREHENSIVE

## 2024-01-24 NOTE — Patient Instructions (Addendum)
 Your procedure is scheduled on:01-28-24 Friday Report to the Registration Desk on the 1st floor of the Medical Mall.Then proceed to the 2nd floor Surgery Desk To find out your arrival time, please call [phone] between 1PM - 3PM on:01-27-24 Thursday If your arrival time is 6:00 am, do not arrive before that time as the Medical Mall entrance doors do not open until 6:00 am.  REMEMBER: Instructions that are not followed completely may result in serious medical risk, up to and including death; or upon the discretion of your surgeon and anesthesiologist your surgery may need to be rescheduled.  Do not eat food OR drink liquids after midnight the night before surgery.  No gum chewing or hard candies.  One week prior to surgery:Stop NOW (01-24-24) Stop Anti-inflammatories (NSAIDS) such as Advil, Aleve, Ibuprofen, Motrin, Naproxen, Naprosyn and Aspirin based products such as Excedrin, Goody's Powder, BC Powder. Stop ANY OVER THE COUNTER supplements until after surgery.  You may however, continue to take Tylenol if needed for pain up until the day of surgery.  Continue taking all of your other prescription medications up until the day of surgery.  ON THE DAY OF SURGERY ONLY TAKE THESE MEDICATIONS WITH SIPS OF WATER : -buPROPion  (WELLBUTRIN  XL)  -citalopram  (CELEXA )  -pantoprazole  (PROTONIX )  -You may take ALPRAZolam  (XANAX ) if needed for anxiety  No Alcohol for 24 hours before or after surgery.  No Smoking including e-cigarettes for 24 hours before surgery.  No chewable tobacco products for at least 6 hours before surgery.  No nicotine patches on the day of surgery.  Do not use any recreational drugs for at least a week (preferably 2 weeks) before your surgery.  Please be advised that the combination of cocaine and anesthesia may have negative outcomes, up to and including death. If you test positive for cocaine, your surgery will be cancelled.  On the morning of surgery brush your  teeth with toothpaste and water , you may rinse your mouth with mouthwash if you wish. Do not swallow any toothpaste or mouthwash.  Do not wear jewelry, make-up, hairpins, clips or nail polish.  For welded (permanent) jewelry: bracelets, anklets, waist bands, etc.  Please have this removed prior to surgery.  If it is not removed, there is a chance that hospital personnel will need to cut it off on the day of surgery.  Do not wear lotions, powders, or perfumes.   Do not shave body hair from the neck down 48 hours before surgery.  Contact lenses, hearing aids and dentures may not be worn into surgery.  Do not bring valuables to the hospital. [facility] is not responsible for any missing/lost belongings or valuables.   Notify your doctor if there is any change in your medical condition (cold, fever, infection).  Wear comfortable clothing (specific to your surgery type) to the hospital.  After surgery, you can help prevent lung complications by doing breathing exercises.  Take deep breaths and cough every 1-2 hours. Your doctor may order a device called an Incentive Spirometer to help you take deep breaths. When coughing or sneezing, hold a pillow firmly against your incision with both hands. This is called "splinting." Doing this helps protect your incision. It also decreases belly discomfort.  If you are being admitted to the hospital overnight, leave your suitcase in the car. After surgery it may be brought to your room.  In case of increased patient census, it may be necessary for you, the patient, to continue your postoperative care in the  Same Day Surgery department.  If you are being discharged the day of surgery, you will not be allowed to drive home. You will need a responsible individual to drive you home and stay with you for 24 hours after surgery.   If you are taking public transportation, you will need to have a responsible individual with you.  Please call the  Pre-admissions Testing Dept. at [phone] if you have any questions about these instructions.  Surgery Visitation Policy:  Patients having surgery or a procedure may have two visitors.  Children under the age of 21 must have an adult with them who is not the patient.   Merchandiser, retail to address health-related social needs:  [email or web address]

## 2024-01-25 ENCOUNTER — Ambulatory Visit: Payer: Self-pay | Admitting: Physician Assistant

## 2024-01-25 ENCOUNTER — Encounter
Admission: RE | Admit: 2024-01-25 | Discharge: 2024-01-25 | Disposition: A | Discharge status: Home / Self Care | Source: Ambulatory Visit | Attending: Urology | Admitting: Urology

## 2024-01-25 DIAGNOSIS — Z01818 Encounter for other preprocedural examination: Secondary | ICD-10-CM | POA: Insufficient documentation

## 2024-01-25 DIAGNOSIS — Z01812 Encounter for preprocedural laboratory examination: Secondary | ICD-10-CM

## 2024-01-25 DIAGNOSIS — Z01810 Encounter for preprocedural cardiovascular examination: Secondary | ICD-10-CM

## 2024-01-25 DIAGNOSIS — I10 Essential (primary) hypertension: Secondary | ICD-10-CM | POA: Diagnosis not present

## 2024-01-25 DIAGNOSIS — N138 Other obstructive and reflux uropathy: Secondary | ICD-10-CM | POA: Diagnosis not present

## 2024-01-25 DIAGNOSIS — N3289 Other specified disorders of bladder: Secondary | ICD-10-CM | POA: Diagnosis not present

## 2024-01-25 DIAGNOSIS — N401 Enlarged prostate with lower urinary tract symptoms: Secondary | ICD-10-CM | POA: Diagnosis not present

## 2024-01-25 DIAGNOSIS — C61 Malignant neoplasm of prostate: Secondary | ICD-10-CM | POA: Diagnosis not present

## 2024-01-25 DIAGNOSIS — Z79899 Other long term (current) drug therapy: Secondary | ICD-10-CM | POA: Diagnosis not present

## 2024-01-25 LAB — CBC
HCT: 41.1 % (ref 39.0–52.0)
Hemoglobin: 13.4 g/dL (ref 13.0–17.0)
MCH: 24.6 pg — ABNORMAL LOW (ref 26.0–34.0)
MCHC: 32.6 g/dL (ref 30.0–36.0)
MCV: 75.6 fL — ABNORMAL LOW (ref 80.0–100.0)
Platelets: 286 K/uL (ref 150–400)
RBC: 5.44 MIL/uL (ref 4.22–5.81)
RDW: 15.3 % (ref 11.5–15.5)
WBC: 5.5 K/uL (ref 4.0–10.5)
nRBC: 0 % (ref 0.0–0.2)

## 2024-01-25 LAB — BASIC METABOLIC PANEL WITH GFR
Anion gap: 8 (ref 5–15)
BUN: 17 mg/dL (ref 6–20)
CO2: 27 mmol/L (ref 22–32)
Calcium: 9.3 mg/dL (ref 8.9–10.3)
Chloride: 104 mmol/L (ref 98–111)
Creatinine, Ser: 1.15 mg/dL (ref 0.61–1.24)
GFR, Estimated: >60 mL/min (ref >60)
Glucose, Bld: 119 mg/dL — ABNORMAL HIGH (ref 70–99)
Potassium: 3.7 mmol/L (ref 3.5–5.1)
Sodium: 139 mmol/L (ref 135–145)

## 2024-01-25 MED ORDER — CIPROFLOXACIN HCL 250 MG PO TABS: 250.0000 mg | ORAL_TABLET | Freq: Two times a day (BID) | ORAL | 0 refills | Status: DC

## 2024-01-25 NOTE — Telephone Encounter (Signed)
-----   Message from [facility] sent at 01/25/2024 12:12 PM EDT ----- Please send in Cipro  250mg  BID x5 days and have him start it ASAP. He may complete the course postop.  ----- Message ----- From: Interface, [facility] Lab Results In Sent: 01/19/2024   4:36 PM EDT To: Samantha Vaillancourt, PA-C

## 2024-01-25 NOTE — Telephone Encounter (Signed)
 Spoke with Pt. Pt. Advised of all results and verbalized understanding about needing to start pre-op abx. Medication sent to [facility] in [place] per patient request.

## 2024-01-28 ENCOUNTER — Ambulatory Visit: Admitting: Certified Registered Nurse Anesthetist

## 2024-01-28 ENCOUNTER — Other Ambulatory Visit: Payer: Self-pay

## 2024-01-28 ENCOUNTER — Encounter
Admission: RE | Disposition: A | Discharge status: Home / Self Care | Payer: Self-pay | Source: Home / Self Care | Attending: Urology

## 2024-01-28 ENCOUNTER — Encounter: Payer: Self-pay | Admitting: Urology

## 2024-01-28 ENCOUNTER — Encounter: Payer: Self-pay | Admitting: Urgent Care

## 2024-01-28 ENCOUNTER — Ambulatory Visit
Admission: RE | Admit: 2024-01-28 | Discharge: 2024-01-28 | Disposition: A | Discharge status: Home / Self Care | Attending: Urology | Admitting: Urology

## 2024-01-28 DIAGNOSIS — N3289 Other specified disorders of bladder: Secondary | ICD-10-CM | POA: Diagnosis not present

## 2024-01-28 DIAGNOSIS — N401 Enlarged prostate with lower urinary tract symptoms: Secondary | ICD-10-CM | POA: Diagnosis not present

## 2024-01-28 DIAGNOSIS — C61 Malignant neoplasm of prostate: Secondary | ICD-10-CM | POA: Diagnosis not present

## 2024-01-28 DIAGNOSIS — Z79899 Other long term (current) drug therapy: Secondary | ICD-10-CM | POA: Diagnosis not present

## 2024-01-28 DIAGNOSIS — N138 Other obstructive and reflux uropathy: Secondary | ICD-10-CM | POA: Diagnosis not present

## 2024-01-28 DIAGNOSIS — I10 Essential (primary) hypertension: Secondary | ICD-10-CM | POA: Insufficient documentation

## 2024-01-28 DIAGNOSIS — E669 Obesity, unspecified: Secondary | ICD-10-CM

## 2024-01-28 SURGERY — ENUCLEATION, PROSTATE, USING LASER, WITH MORCELLATION
Anesthesia: General | Site: Prostate

## 2024-01-28 MED ORDER — FENTANYL CITRATE (PF) 100 MCG/2ML IJ SOLN
INTRAMUSCULAR | Status: AC
  Filled 2024-01-28: qty 2

## 2024-01-28 MED ORDER — CHLORHEXIDINE GLUCONATE 0.12 % MT SOLN
OROMUCOSAL | Status: AC
  Filled 2024-01-28: qty 15

## 2024-01-28 MED ORDER — FENTANYL CITRATE (PF) 100 MCG/2ML IJ SOLN: 25.0000 ug | INTRAMUSCULAR | Status: DC | PRN

## 2024-01-28 MED ORDER — ORAL CARE MOUTH RINSE: 15.0000 mL | Freq: Once | OROMUCOSAL | Status: AC

## 2024-01-28 MED ORDER — PROPOFOL 10 MG/ML IV BOLUS
INTRAVENOUS | Status: AC
  Filled 2024-01-28: qty 20

## 2024-01-28 MED ORDER — PROPOFOL 1000 MG/100ML IV EMUL
INTRAVENOUS | Status: AC
  Filled 2024-01-28: qty 100

## 2024-01-28 MED ORDER — MIDAZOLAM HCL 2 MG/2ML IJ SOLN
INTRAMUSCULAR | Status: DC | PRN
  Administered 2024-01-28: 2 mg via INTRAVENOUS

## 2024-01-28 MED ORDER — CEFAZOLIN SODIUM-DEXTROSE 2-4 GM/100ML-% IV SOLN
2.0000 g | INTRAVENOUS | Status: AC
  Administered 2024-01-28: 2 g via INTRAVENOUS

## 2024-01-28 MED ORDER — CHLORHEXIDINE GLUCONATE 0.12 % MT SOLN
15.0000 mL | Freq: Once | OROMUCOSAL | Status: AC
  Administered 2024-01-28: 15 mL via OROMUCOSAL

## 2024-01-28 MED ORDER — SUGAMMADEX SODIUM 200 MG/2ML IV SOLN
INTRAVENOUS | Status: DC | PRN
  Administered 2024-01-28: 200 mg via INTRAVENOUS

## 2024-01-28 MED ORDER — CEFAZOLIN SODIUM-DEXTROSE 2-4 GM/100ML-% IV SOLN
INTRAVENOUS | Status: AC
  Filled 2024-01-28: qty 100

## 2024-01-28 MED ORDER — TRAMADOL HCL 50 MG PO TABS: 25.0000 mg | ORAL_TABLET | Freq: Four times a day (QID) | ORAL | 0 refills | Status: AC | PRN

## 2024-01-28 MED ORDER — LIDOCAINE HCL (PF) 2 % IJ SOLN
INTRAMUSCULAR | Status: AC
Start: 2024-01-28 — End: 2024-01-28
  Filled 2024-01-28: qty 5

## 2024-01-28 MED ORDER — FENTANYL CITRATE (PF) 100 MCG/2ML IJ SOLN
INTRAMUSCULAR | Status: DC | PRN
  Administered 2024-01-28: 100 ug via INTRAVENOUS

## 2024-01-28 MED ORDER — OXYBUTYNIN CHLORIDE ER 10 MG PO TB24: 10.0000 mg | ORAL_TABLET | Freq: Every day | ORAL | 0 refills | Status: DC | PRN

## 2024-01-28 MED ORDER — PROPOFOL 10 MG/ML IV BOLUS
INTRAVENOUS | Status: DC | PRN
  Administered 2024-01-28: 200 mg via INTRAVENOUS
  Administered 2024-01-28: 100 mg via INTRAVENOUS
  Administered 2024-01-28: 40 mg via INTRAVENOUS

## 2024-01-28 MED ORDER — BELLADONNA ALKALOIDS-OPIUM 16.2-60 MG RE SUPP
RECTAL | Status: AC
Start: 2024-01-28 — End: 2024-01-28
  Filled 2024-01-28: qty 1

## 2024-01-28 MED ORDER — KETAMINE HCL 50 MG/5ML IJ SOSY
PREFILLED_SYRINGE | INTRAMUSCULAR | Status: DC | PRN
  Administered 2024-01-28: 50 mg via INTRAVENOUS

## 2024-01-28 MED ORDER — ONDANSETRON HCL 4 MG/2ML IJ SOLN
INTRAMUSCULAR | Status: DC | PRN
  Administered 2024-01-28: 4 mg via INTRAVENOUS

## 2024-01-28 MED ORDER — MIDAZOLAM HCL 2 MG/2ML IJ SOLN
INTRAMUSCULAR | Status: AC
Start: 2024-01-28 — End: 2024-01-28
  Filled 2024-01-28: qty 2

## 2024-01-28 MED ORDER — OXYCODONE HCL 5 MG PO TABS: 5.0000 mg | ORAL_TABLET | Freq: Once | ORAL | Status: DC | PRN

## 2024-01-28 MED ORDER — ROCURONIUM BROMIDE 10 MG/ML (PF) SYRINGE
PREFILLED_SYRINGE | INTRAVENOUS | Status: AC
  Filled 2024-01-28: qty 10

## 2024-01-28 MED ORDER — METOPROLOL TARTRATE 5 MG/5ML IV SOLN
INTRAVENOUS | Status: DC | PRN
  Administered 2024-01-28: 1 mg via INTRAVENOUS
  Administered 2024-01-28: 2 mg via INTRAVENOUS

## 2024-01-28 MED ORDER — DEXMEDETOMIDINE HCL IN NACL 80 MCG/20ML IV SOLN
INTRAVENOUS | Status: DC | PRN
  Administered 2024-01-28: 8 ug via INTRAVENOUS
  Administered 2024-01-28: 4 ug via INTRAVENOUS

## 2024-01-28 MED ORDER — PROPOFOL 500 MG/50ML IV EMUL
INTRAVENOUS | Status: DC | PRN
  Administered 2024-01-28: 150 ug/kg/min via INTRAVENOUS

## 2024-01-28 MED ORDER — LIDOCAINE HCL (CARDIAC) PF 100 MG/5ML IV SOSY
PREFILLED_SYRINGE | INTRAVENOUS | Status: DC | PRN
  Administered 2024-01-28: 100 mg via INTRAVENOUS

## 2024-01-28 MED ORDER — BELLADONNA ALKALOIDS-OPIUM 16.2-60 MG RE SUPP
RECTAL | Status: DC | PRN
  Administered 2024-01-28: 1 via RECTAL

## 2024-01-28 MED ORDER — DEXAMETHASONE SODIUM PHOSPHATE 10 MG/ML IJ SOLN
INTRAMUSCULAR | Status: DC | PRN
  Administered 2024-01-28: 5 mg via INTRAVENOUS

## 2024-01-28 MED ORDER — OXYCODONE HCL 5 MG/5ML PO SOLN: 5.0000 mg | Freq: Once | ORAL | Status: DC | PRN

## 2024-01-28 MED ORDER — PROPOFOL 10 MG/ML IV BOLUS
INTRAVENOUS | Status: AC
Start: 2024-01-28 — End: 2024-01-28
  Filled 2024-01-28: qty 20

## 2024-01-28 MED ORDER — KETAMINE HCL 50 MG/5ML IJ SOSY
PREFILLED_SYRINGE | INTRAMUSCULAR | Status: AC
  Filled 2024-01-28: qty 5

## 2024-01-28 MED ORDER — SODIUM CHLORIDE 0.9 % IR SOLN
Status: DC | PRN
  Administered 2024-01-28: 6000 mL
  Administered 2024-01-28 (×3): 15000 mL

## 2024-01-28 MED ORDER — STERILE WATER FOR IRRIGATION IR SOLN
Status: DC | PRN
  Administered 2024-01-28: 1000 mL

## 2024-01-28 MED ORDER — ROCURONIUM BROMIDE 100 MG/10ML IV SOLN
INTRAVENOUS | Status: DC | PRN
  Administered 2024-01-28 (×2): 10 mg via INTRAVENOUS
  Administered 2024-01-28: 50 mg via INTRAVENOUS
  Administered 2024-01-28: 10 mg via INTRAVENOUS

## 2024-01-28 MED ORDER — LACTATED RINGERS IV SOLN: INTRAVENOUS | Status: DC

## 2024-01-28 SURGICAL SUPPLY — 26 items
ADAPTER IRRIG TUBE 2 SPIKE SOL (ADAPTER) ×2 IMPLANT
BAG URO DRAIN 4000ML (MISCELLANEOUS) ×1 IMPLANT
CATH URETL OPEN END 4X70 (CATHETERS) ×1 IMPLANT
CATH URTH STD 24FR FL 3W 2 (CATHETERS) ×1 IMPLANT
CONTAINER COLLECT MORCELLATR (MISCELLANEOUS) ×1 IMPLANT
DRAPE UTILITY 15X26 TOWEL STRL (DRAPES) IMPLANT
ELECTRD BIVAP BIPO 22/24 DONUT (ELECTROSURGICAL) IMPLANT
FIBER LASER MOSES 550 DFL (Laser) ×1 IMPLANT
FILTER OVERFLOW MORCELLATOR (FILTER) ×1 IMPLANT
GLOVE BIOGEL PI IND STRL 7.5 (GLOVE) ×1 IMPLANT
GOWN STRL REUS W/ TWL LRG LVL3 (GOWN DISPOSABLE) ×1 IMPLANT
GOWN STRL REUS W/ TWL XL LVL3 (GOWN DISPOSABLE) ×1 IMPLANT
HOLDER FOLEY CATH W/STRAP (MISCELLANEOUS) ×1 IMPLANT
KIT TURNOVER CYSTO (KITS) ×1 IMPLANT
LOOP CUT BIPOLAR 24F LRG (ELECTROSURGICAL) IMPLANT
MEMBRANE SLNG YLW 17 FOR INST (MISCELLANEOUS) ×1 IMPLANT
MORCELLATOR ROTATION 4.75 335 (MISCELLANEOUS) ×1 IMPLANT
PACK CYSTO AR (MISCELLANEOUS) ×1 IMPLANT
SET CYSTO W/LG BORE CLAMP LF (SET/KITS/TRAYS/PACK) ×1 IMPLANT
SET IRRIG Y TYPE TUR BLADDER L (SET/KITS/TRAYS/PACK) ×1 IMPLANT
SLEEVE PROTECTION STRL DISP (MISCELLANEOUS) ×2 IMPLANT
SOL .9 NS 3000ML IRR UROMATIC (IV SOLUTION) ×5 IMPLANT
SURGILUBE 2OZ TUBE FLIPTOP (MISCELLANEOUS) ×1 IMPLANT
SYRINGE TOOMEY IRRIG 70ML (MISCELLANEOUS) ×1 IMPLANT
TUBE PUMP MORCELLATOR PIRANHA (TUBING) ×1 IMPLANT
WATER STERILE IRR 1000ML POUR (IV SOLUTION) ×1 IMPLANT

## 2024-01-28 NOTE — Transfer of Care (Signed)
 Immediate Anesthesia Transfer of Care Note  Patient: Johnathan Eaton  Procedure(s) Performed: ENUCLEATION, PROSTATE, USING LASER, WITH MORCELLATION (Prostate)  Patient Location: PACU  Anesthesia Type:General  Level of Consciousness: drowsy  Airway & Oxygen Therapy: Patient Spontanous Breathing and Patient connected to face mask oxygen  Post-op Assessment: Report given to RN and Post -op Vital signs reviewed and stable  Post vital signs: Reviewed and stable  Last Vitals:  Vitals Value Taken Time  BP 109/80 01/28/24 14:32  Temp    Pulse 78 01/28/24 14:33  Resp 19 01/28/24 14:33  SpO2 99 % 01/28/24 14:33  Vitals shown include unfiled device data.  Last Pain:  Vitals:   01/28/24 1115  TempSrc: Temporal         Complications: No notable events documented.

## 2024-01-28 NOTE — Op Note (Signed)
 Date of procedure: 01/28/24  Preoperative diagnosis:  BPH with obstruction  Postoperative diagnosis:  Same  Procedure: HoLEP (Holmium Laser Enucleation of the Prostate)  Surgeon: Redell Burnet, MD  Anesthesia: General  Complications: None  Intraoperative findings:  Large prostate with obstructing lateral lobes, mild bladder trabeculations, no suspicious bladder lesions Ureteral orifices and verumontanum intact at conclusion of case Challenging morcellation with fibrous tissue  EBL: 20 mL  Specimens: Prostate chips  Enucleation time: 20 minutes  Morcellation time: 40 minutes  Intra-op weight: 41g  Drains: 24 French three-way, 60 cc in balloon  Indication: Johnathan Eaton is a 53 y.o. patient with BPH and Foley dependent urinary retention who opted for HOLEP.  He does have a history of elevated PSA at the time of prostatitis but never followed up for repeat PSA, he deferred prostate biopsy for further evaluation prior to HOLEP, CT showed no evidence of prostate cancer.  After reviewing the management options for treatment, they elected to proceed with the above surgical procedure(s). We have discussed the potential benefits and risks of the procedure, side effects of the proposed treatment, the likelihood of the patient achieving the goals of the procedure, and any potential problems that might occur during the procedure or recuperation.  We specifically discussed the risks of bleeding, infection, hematuria and clot retention, need for additional procedures, possible overnight hospital stay, temporary urgency and incontinence, rare long-term incontinence, and retrograde ejaculation.  Informed consent has been obtained.   Description of procedure:  The patient was taken to the operating room and general anesthesia was induced.  The patient was placed in the dorsal lithotomy position, prepped and draped in the usual sterile fashion, and preoperative antibiotics were administered.   SCDs were placed for DVT prophylaxis.  A preoperative time-out was performed.   Fleeta Needs sounds were used to gently dilated the urethra up to 23F. The 32 French continuous flow resectoscope was inserted into the urethra using the visual obturator  The prostate was large with obstructing lateral lobes and an elevated bladder neck. The bladder was thoroughly inspected and notable for mild bladder trabeculations but no suspicious lesions.  The ureteral orifices were located in orthotopic position.    The laser was set to 2 J and 60 Hz and early apical release was performed by making a circumferential mucosal incision proximal to the sphincter.  A lambda incision was then made proximal to the verumontanum.  The prostate was enucleated en bloc circumferentially into the bladder.  The capsule was examined and laser was used for meticulous hemostasis.    The 61 French resectoscope was then switched out for the 26 French nephroscope and prostate tissue was morcellated(Piranha).  There was a large fibrous beach ball adenoma remaining that could not be morcellated, and I used the large bipolar resecting loop to cut this down to smaller pieces that were then irrigated free and sent to pathology.  The loop was used to touch up hemostasis around the bladder neck.  A 24 French three-way catheter was inserted easily with the aid of a catheter guide, and 60 cc were placed in the balloon.  Urine was clear.  The catheter irrigated easily with a Toomey syringe.  CBI was initiated.  Belladonna suppository was placed.  The patient tolerated the procedure well without any immediate complications and was extubated and transferred to the recovery room in stable condition.  Urine was clear on fast CBI.  Disposition: Stable to PACU  Plan: Wean CBI in PACU, anticipate discharge  home today with Foley removal in clinic in 2-3 days  Redell Burnet, MD 01/28/2024

## 2024-01-28 NOTE — Anesthesia Postprocedure Evaluation (Signed)
 Anesthesia Post Note  Patient: Johnathan Eaton  Procedure(s) Performed: ENUCLEATION, PROSTATE, USING LASER, WITH MORCELLATION (Prostate)  Patient location during evaluation: PACU Anesthesia Type: General Level of consciousness: awake and alert Pain management: pain level controlled Vital Signs Assessment: post-procedure vital signs reviewed and stable Respiratory status: spontaneous breathing, nonlabored ventilation, respiratory function stable and patient connected to nasal cannula oxygen Cardiovascular status: blood pressure returned to baseline and stable Postop Assessment: no apparent nausea or vomiting Anesthetic complications: no   No notable events documented.   Last Vitals:  Vitals:   01/28/24 1515 01/28/24 1530  BP: (!) 125/91 (!) 130/94  Pulse: 73 64  Resp: 12 10  Temp:  37.1 C  SpO2: 96% 99%    Last Pain:  Vitals:   01/28/24 1530  TempSrc:   PainSc: 0-No pain                 Lendia LITTIE Mae
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
## 2024-01-28 NOTE — Interval H&P Note (Signed)
 UROLOGY H&P UPDATE  Agree with prior H&P dated 01/11/2024.  53 year old male with worsening obstructive symptoms culminating in urinary retention.  History of elevated PSA of 30 at time of prostatitis in February 2025 treated by PCP, never returned for follow-up for recheck as recommended.  He deferred prostate biopsy for elevated PSA.  Has not tolerated catheter well.  CT shows no evidence of prostate cancer, prostate measures 50 g.  We discussed possibility of identifying prostate cancer that could require additional treatments in the future, he understands these risks and opts to proceed with HOLEP.  He failed voiding trials.  Cardiac: RRR Lungs: CTA bilaterally  Laterality: N/A Procedure: HOLEP  Urine: Culture with E. coli treated with antibiotics  We discussed the risks and benefits of HoLEP at length.  The procedure requires general anesthesia and takes 1 to 2 hours, and a holmium laser is used to enucleate the prostate and push this tissue into the bladder.  A morcellator is then used to remove this tissue, which is sent for pathology.  The vast majority(>95%) of patients are able to discharge the same day with a catheter in place for 2 to 3 days, and will follow-up in clinic for a voiding trial.  We specifically discussed the risks of bleeding, infection, retrograde ejaculation, temporary urgency and urge incontinence, very low risk of long-term incontinence, urethral stricture/bladder neck contracture, pathologic evaluation of prostate tissue and possible detection of prostate cancer or other malignancy, and possible need for additional procedures.   Johnathan JAYSON Burnet, MD 01/28/2024

## 2024-01-28 NOTE — Anesthesia Procedure Notes (Signed)
 Procedure Name: Intubation Date/Time: 01/28/2024 12:33 PM  Performed by: Brien Sotero PARAS, CRNAPre-anesthesia Checklist: Patient identified, Patient being monitored, Timeout performed, Emergency Drugs available and Suction available Patient Re-evaluated:Patient Re-evaluated prior to induction Oxygen Delivery Method: Circle system utilized Preoxygenation: Pre-oxygenation with 100% oxygen Induction Type: IV induction Ventilation: Mask ventilation without difficulty Laryngoscope Size: Mac, McGrath and 4 Grade View: Grade I Tube type: Oral Tube size: 7.5 mm Number of attempts: 1 Airway Equipment and Method: Stylet Placement Confirmation: ETT inserted through vocal cords under direct vision, positive ETCO2 and breath sounds checked- equal and bilateral Secured at: 21 cm Tube secured with: Tape Dental Injury: Teeth and Oropharynx as per pre-operative assessment

## 2024-01-29 ENCOUNTER — Encounter: Payer: Self-pay | Admitting: Urology

## 2024-02-01 ENCOUNTER — Ambulatory Visit (INDEPENDENT_AMBULATORY_CARE_PROVIDER_SITE_OTHER): Admitting: Physician Assistant

## 2024-02-01 ENCOUNTER — Encounter: Payer: Self-pay | Admitting: Physician Assistant

## 2024-02-01 VITALS — BP 170/105 | HR 99 | Ht 69.5 in | Wt 212.0 lb

## 2024-02-01 DIAGNOSIS — N401 Enlarged prostate with lower urinary tract symptoms: Secondary | ICD-10-CM

## 2024-02-01 DIAGNOSIS — R338 Other retention of urine: Secondary | ICD-10-CM

## 2024-02-01 NOTE — Patient Instructions (Signed)

## 2024-02-01 NOTE — Progress Notes (Signed)
 Catheter Removal  Patient is present today for a catheter removal.  56ml of water  was drained from the balloon. A 24FR three-way foley cath was removed from the bladder, no complications were noted. Patient tolerated well.  Performed by: Ram Haugan, PA-C   Additional notes: Counseled patient on normal postoperative findings including dysuria, gross hematuria, and urinary urgency/leakage. Counseled patient to begin Kegel exercises 3x10 sets daily to increase urinary control and wear absorbent products as needed for security. Written and verbal resources provided today. Surgical pathology pending; will defer to Dr. Francisca to share results when available.  I provided him a work note today that he may return on full duty on 02/14/2024.  Follow up: Return in about 3 months (around 05/02/2024) for Postop f/u with Dr. Francisca.

## 2024-02-02 LAB — SURGICAL PATHOLOGY

## 2024-02-03 ENCOUNTER — Telehealth: Payer: Self-pay | Admitting: Urology

## 2024-02-03 NOTE — Telephone Encounter (Signed)
 Urology telephone note  53 year old male who underwent HOLEP on 01/28/2024 for Foley dependent urinary retention  I called him today to review pathology results of prostate cancer with 90% of tissue involved.  He was very distraught over the phone.  I offered to call his wife as well, and set up an in person visit  Will schedule in person visit to review pathology results with both patient and his wife  CT A/P 01/14/2024 showed no evidence of metastatic prostate cancer  He is urinating with a strong stream, denies any hematuria or incontinence.  Likely plan for repeat PSA in 6 weeks, and likely referral to radiation oncology to consider XRT  Redell Burnet, MD 02/03/2024

## 2024-02-07 ENCOUNTER — Ambulatory Visit: Admitting: Family Medicine

## 2024-02-08 ENCOUNTER — Ambulatory Visit (INDEPENDENT_AMBULATORY_CARE_PROVIDER_SITE_OTHER): Admitting: Urology

## 2024-02-08 VITALS — BP 155/103 | HR 77 | Wt 207.4 lb

## 2024-02-08 DIAGNOSIS — C61 Malignant neoplasm of prostate: Secondary | ICD-10-CM

## 2024-02-08 DIAGNOSIS — R972 Elevated prostate specific antigen [PSA]: Secondary | ICD-10-CM

## 2024-02-08 NOTE — Progress Notes (Signed)
   02/08/2024 9:36 AM   Johnathan Eaton [birth year] [id number]  Reason for visit: New diagnosis of prostate cancer  History: Worsening obstructive urinary symptoms over the last 6 months culminating in Foley dependent urinary retention, failed voiding trial Seen by PCP February 2025 and diagnosed with prostatitis treated with antibiotics, PSA was elevated at 30, he never returned for repeat PSA as recommended CT abdomen pelvis with contrast 01/14/2024 showed no evidence of prostate cancer or lymphadenopathy, prostate measured 50g DRE benign, he was offered prostate biopsy prior to outlet procedure but he deferred HOLEP 01/28/2024, 42 g tissue with 90% involvement of Gleason score 4+3=7 prostate adenocarcinoma  Physical Exam: BP (!) 155/103 (BP Location: Left Arm, Patient Position: Sitting, Cuff Size: Large)   Pulse 77   Wt 207 lb 6.4 oz (94.1 kg)   SpO2 99%   BMI 30.19 kg/m    Today: Urinating with a strong stream, no incontinence, mild dysuria  Plan:   We discussed his new diagnosis of prostate cancer at length.  Very long conversation with him and his wife about treatment options for prostate cancer after HOLEP.  I recommended starting by checking a PSA in 3 weeks, if PSA is less than 1 could consider monitoring, but high likelihood will need additional treatment.  I think radiation would have the lowest risk of side effects after recent HOLEP, and he is currently urinating well with no symptoms or incontinence.  Risks and benefits discussed at length including side effects of ADT. Repeat PSA in 3 weeks, consider PSMA PET scan pending those results Referral placed to radiation oncology  I spent 30 total minutes on the day of the encounter including pre-visit review of the medical record, face-to-face time with the patient, and post visit ordering of labs/imaging/tests.   Redell JAYSON Burnet, MD  [facility] [address], [address] [place], [place] [address] [phone]

## 2024-02-08 NOTE — Patient Instructions (Addendum)
 Gleason score 7 intermediate risk prostate cancer.  We removed about 85% of your prostate tissue, so until we recheck a PSA level in 3 weeks we do not know how much, if any prostate cancer may be left in the outer shell of your prostate.  If your PSA is 0, we can monitor closely, however if the PSA is more than 1 likely will need radiation treatments to kill any remaining microscopic cancer cells.   Prostate Cancer  The prostate is a small gland that produces fluid that makes up semen (seminal fluid). It is located below the bladder in men, in front of the rectum. Prostate cancer is the abnormal growth of cells in the prostate gland. What are the causes? The exact cause of this condition is not known. What increases the risk? You are more likely to develop this condition if: You are 54 years of age or older. You have a family history of prostate cancer. You have a family history of breast and ovarian cancer. You have genes that are passed from parent to child (inherited), such as BRCA1 and BRCA2. You have Lynch syndrome. African American men and men of African descent are diagnosed with prostate cancer at higher rates than other men. The reasons for this are not well understood and are likely due to a combination of genetic and environmental factors. What are the signs or symptoms? Symptoms of this condition include: Problems with urination. This may include: A weak or interrupted flow of urine. Trouble starting or stopping urination. Trouble emptying the bladder all the way. The need to urinate more often, especially at night. Blood in urine or semen. Persistent pain or discomfort in the lower back, lower abdomen, or hips. Trouble getting an erection. Weakness or numbness in the legs or feet. How is this diagnosed? This condition can be diagnosed with: A digital rectal exam. For this exam, a health care provider inserts a gloved finger into the rectum to feel the prostate gland. A  blood test called a prostate-specific antigen (PSA) test. A procedure in which a sample of tissue is taken from the prostate and checked under a microscope (prostate biopsy). An imaging test called transrectal ultrasonography. Once the condition is diagnosed, tests will be done to determine how far the cancer has spread. This is called staging the cancer. Staging may involve imaging tests, such as a bone scan, CT scan, PET scan, or MRI. Stages of prostate cancer The stages of prostate cancer are as follows: Stage 1 (I). At this stage, the cancer is found in the prostate only. The cancer is not visible on imaging tests, and it is usually found by accident, such as during prostate surgery. Stage 2 (II). At this stage, the cancer is more advanced than it is in stage 1, but the cancer has not spread outside the prostate. Stage 3 (III). At this stage, the cancer has spread beyond the outer layer of the prostate to nearby tissues. The cancer may be found in the seminal vesicles, which are near the bladder and the prostate. Stage 4 (IV). At this stage, the cancer has spread to other parts of the body, such as the lymph nodes, bones, bladder, rectum, liver, or lungs. Prostate cancer grading Prostate cancer is also graded according to how the cancer cells look under a microscope. This is called the Gleason score and the total score can range from 6-10, indicating how likely it is that the cancer will spread (metastasize) to other parts of the body. The  higher the score, the greater the likelihood that the cancer will spread. Gleason 6 or lower: This indicates that the cancer cells look similar to normal prostate cells (well differentiated). Gleason 7: This indicates that the cancer cells look somewhat similar to normal prostate cells (moderately differentiated). Gleason 8, 9, or 10: This indicates that the cancer cells look very different than normal prostate cells (poorly differentiated). How is this  treated? Treatment for this condition depends on several factors, including the stage of the cancer, your age, personal preferences, and your overall health. Talk with your health care provider about treatment options that are recommended for you. Common treatments include: Observation for early stage prostate cancer (active surveillance). This involves having exams, blood tests, and in some cases, more biopsies. For some men, this is the only treatment needed. Surgery. Types of surgeries include: Open surgery (radical prostatectomy). In this surgery, a larger incision is made to remove the prostate. A laparoscopic radical prostatectomy. This is a surgery to remove the prostate and lymph nodes through several small incisions. It is often referred to as a minimally invasive surgery. A robotic radical prostatectomy. This is laparoscopic surgery to remove the prostate and lymph nodes with the help of robotic arms that are controlled by the surgeon. Cryoablation. This is surgery to freeze and destroy cancer cells. Radiation treatment. Types of radiation treatment include: External beam radiation. This type aims beams of radiation from outside the body at the prostate to destroy cancerous cells. Brachytherapy. This type uses radioactive needles, seeds, wires, or tubes that are implanted into the prostate gland. Like external beam radiation, brachytherapy destroys cancerous cells. An advantage is that this type of radiation limits the damage to surrounding tissue and has fewer side effects. Chemotherapy. This treatment kills cancer cells or stops them from multiplying. It kills both cancer cells and normal cells. Targeted therapy. This treatment uses medicines to kill cancer cells without damaging normal cells. Hormone treatment. This treatment involves taking medicines that act on testosterone, one of the male hormones, by: Stopping your body from producing testosterone. Blocking testosterone from reaching  cancer cells. Follow these instructions at home: Lifestyle Do not use any products that contain nicotine or tobacco. These products include cigarettes, chewing tobacco, and vaping devices, such as e-cigarettes. If you need help quitting, ask your health care provider. Eat a healthy diet. To do this: Eat foods that are high in fiber. These include beans, whole grains, and fresh fruits and vegetables. Limit foods that are high in fat and sugar. These include fried or sweet foods. Treatment for prostate cancer may affect sexual function. If you have a partner, continue to have intimate moments. This may include touching, holding, hugging, and caressing your partner. Get plenty of sleep. Consider joining a support group for men who have prostate cancer. Meeting with a support group may help you learn to manage the stress of having cancer. General instructions Take over-the-counter and prescription medicines only as told by your health care provider. If you have to go to the hospital, notify your cancer specialist (oncologist). Keep all follow-up visits. This is important. Where to find more information American Cancer Society: www.cancer.org American Society of Clinical Oncology: www.cancer.net [employer]: www.cancer.gov Contact a health care provider if: You have new or increasing trouble urinating. You have new or increasing blood in your urine. You have new or increasing pain in your hips, back, or chest. Get help right away if: You have weakness or numbness in your legs. You cannot  control urination or your bowel movements (incontinence). You have chills or a fever. Summary The prostate is a small gland that is involved in the production of semen. It is located below a man's bladder, in front of the rectum. Prostate cancer is the abnormal growth of cells in the prostate gland. Treatment for this condition depends on the stage of the cancer, your age, personal preferences,  and your overall health. Talk with your health care provider about treatment options that are recommended for you. Consider joining a support group for men who have prostate cancer. Meeting with a support group may help you learn to manage the stress of having cancer. This information is not intended to replace advice given to you by your health care provider. Make sure you discuss any questions you have with your health care provider. Document Revised: 08/14/2020 Document Reviewed: 08/14/2020 Elsevier Patient Education  2024 [employer].

## 2024-02-16 ENCOUNTER — Ambulatory Visit
Admission: RE | Admit: 2024-02-16 | Discharge: 2024-02-16 | Disposition: A | Discharge status: Home / Self Care | Source: Ambulatory Visit | Attending: Radiation Oncology | Admitting: Radiation Oncology

## 2024-02-16 ENCOUNTER — Encounter: Payer: Self-pay | Admitting: Radiation Oncology

## 2024-02-16 ENCOUNTER — Telehealth: Payer: Self-pay

## 2024-02-16 VITALS — BP 157/107 | HR 103 | Temp 97.6°F | Resp 15 | Ht 69.0 in | Wt 209.0 lb

## 2024-02-16 DIAGNOSIS — R338 Other retention of urine: Secondary | ICD-10-CM | POA: Insufficient documentation

## 2024-02-16 DIAGNOSIS — I10 Essential (primary) hypertension: Secondary | ICD-10-CM | POA: Diagnosis not present

## 2024-02-16 DIAGNOSIS — Z923 Personal history of irradiation: Secondary | ICD-10-CM | POA: Diagnosis not present

## 2024-02-16 DIAGNOSIS — Z801 Family history of malignant neoplasm of trachea, bronchus and lung: Secondary | ICD-10-CM | POA: Insufficient documentation

## 2024-02-16 DIAGNOSIS — Z79899 Other long term (current) drug therapy: Secondary | ICD-10-CM | POA: Insufficient documentation

## 2024-02-16 DIAGNOSIS — C61 Malignant neoplasm of prostate: Secondary | ICD-10-CM | POA: Insufficient documentation

## 2024-02-16 DIAGNOSIS — K219 Gastro-esophageal reflux disease without esophagitis: Secondary | ICD-10-CM | POA: Insufficient documentation

## 2024-02-16 DIAGNOSIS — Z191 Hormone sensitive malignancy status: Secondary | ICD-10-CM | POA: Diagnosis not present

## 2024-02-16 NOTE — Consult Note (Signed)
 NEW PATIENT EVALUATION  Name: Johnathan Eaton  MRN: [id number]  Date:   02/16/2024     DOB: [birth year]   This 53 y.o. male patient presents to the clinic for initial evaluation of at least age 3A (cT2 cN0 M0) Gleason 7 (4+3) adenocarcinoma of the prostate presenting with a PSA in the 30 range.  REFERRING PHYSICIAN: Sowles, Krichna, MD  CHIEF COMPLAINT:  Chief Complaint  Patient presents with   Prostate Cancer    DIAGNOSIS: The encounter diagnosis was Malignant neoplasm of prostate (HCC).   PREVIOUS INVESTIGATIONS:  PSMA PET scan ordered CT scan reviewed Clinical notes reviewed Pathology report reviewed  HPI: Patient is a 76Old male whose history dates back to early 24 and had a PSA of 30.  He was seen by urology for worsening obstructive symptoms culminating in urinary retention.  His initial PSA of 30 was thought to be from prostatitis.  He was lost to follow-up and deferred prostate biopsy.  He was seen by urology and underwent a HoLEP procedure.  Pathology results of that procedure showed Gleason 7 (4+3) adenocarcinoma in 90% of tissue submitted.  Perineural invasion was identified.  Patient did have a CT scan in August showing diffuse of the urinary bladder wall thickening and fat stranding suggestive of nonspecific infectious or inflammatory cystitis.  Also had prostamegaly.  He has had a new PSA ordered not yet available.  He is referred to radiation oncology with recommendation by urology that radiation may be his best avenue of treatment.  He is seen today and is doing fairly well.  His urinary symptoms have improved.  He specifically denies bone pain or any bowel problems.  PLANNED TREATMENT REGIMEN: PSMA PET scan plus IMRT radiation therapy  PAST MEDICAL HISTORY:  has a past medical history of Abnormal red blood cells, Allergy, Anxiety, Asthma, BPH with urinary obstruction, Depression, Dyslipidemia, Elevated PSA, GERD (gastroesophageal reflux disease), Gout, Hyperglycemia,  Hypertension, Obesity, and Seizures (HCC) (2000).    PAST SURGICAL HISTORY:  Past Surgical History:  Procedure Laterality Date   COLONOSCOPY  12/04/2020   HOLEP-LASER ENUCLEATION OF THE PROSTATE WITH MORCELLATION N/A 01/28/2024   Procedure: ENUCLEATION, PROSTATE, USING LASER, WITH MORCELLATION;  Surgeon: Francisca Redell BROCKS, MD;  Location: [facility] ORS;  Service: Urology;  Laterality: N/A;   SHOULDER SURGERY Left    Rotator Cuff Repair   UPPER GASTROINTESTINAL ENDOSCOPY  12/04/2020   also has had in past in [place], 2007-2008    FAMILY HISTORY: family history includes Dementia in his father and mother; Hypertension in his brother, father, and mother; Lung cancer in his mother; Pneumonia in his mother.  SOCIAL HISTORY:  reports that he has never smoked. He has never used smokeless tobacco. He reports that he does not drink alcohol and does not use drugs.  ALLERGIES: Patient has no known allergies.  MEDICATIONS:  Current Outpatient Medications  Medication Sig Dispense Refill   ALPRAZolam  (XANAX ) 0.5 MG tablet Take 1 tablet (0.5 mg total) by mouth 2 (two) times daily as needed for anxiety. 30 tablet 0   amLODipine -valsartan  (EXFORGE ) 5-160 MG tablet Take 1 tablet by mouth daily. For blood pressure 90 tablet 1   buPROPion  (WELLBUTRIN  XL) 150 MG 24 hr tablet Take 1 tablet (150 mg total) by mouth every morning. For depression/energy 90 tablet 1   citalopram  (CELEXA ) 20 MG tablet TAKE 1 TABLET BY MOUTH EVERY DAY 90 tablet 0   pantoprazole  (PROTONIX ) 40 MG tablet Take 1 tablet (40 mg total) by mouth daily  before supper. For heartburn 90 tablet 1   ciprofloxacin  (CIPRO ) 250 MG tablet Take 1 tablet (250 mg total) by mouth every 12 (twelve) hours. (Patient not taking: Reported on 02/16/2024) 10 tablet 0   oxybutynin  (DITROPAN  XL) 10 MG 24 hr tablet Take 1 tablet (10 mg total) by mouth daily as needed (Bladder pain, bladder spasm). (Patient not taking: Reported on 02/16/2024) 5 tablet 0   No current  facility-administered medications for this encounter.    ECOG PERFORMANCE STATUS:  0 - Asymptomatic  REVIEW OF SYSTEMS: Patient denies any weight loss, fatigue, weakness, fever, chills or night sweats. Patient denies any loss of vision, blurred vision. Patient denies any ringing  of the ears or hearing loss. No irregular heartbeat. Patient denies heart murmur or history of fainting. Patient denies any chest pain or pain radiating to her upper extremities. Patient denies any shortness of breath, difficulty breathing at night, cough or hemoptysis. Patient denies any swelling in the lower legs. Patient denies any nausea vomiting, vomiting of blood, or coffee ground material in the vomitus. Patient denies any stomach pain. Patient states has had normal bowel movements no significant constipation or diarrhea. Patient denies any dysuria, hematuria or significant nocturia. Patient denies any problems walking, swelling in the joints or loss of balance. Patient denies any skin changes, loss of hair or loss of weight. Patient denies any excessive worrying or anxiety or significant depression. Patient denies any problems with insomnia. Patient denies excessive thirst, polyuria, polydipsia. Patient denies any swollen glands, patient denies easy bruising or easy bleeding. Patient denies any recent infections, allergies or URI. Patient s visual fields have not changed significantly in recent time.   PHYSICAL EXAM: BP (!) 157/107   Pulse (!) 103   Temp 97.6 F (36.4 C) (Tympanic)   Resp 15   Ht 5' 9 (1.753 m)   Wt 209 lb (94.8 kg)   BMI 30.86 kg/m  Well-developed well-nourished patient in NAD. HEENT reveals PERLA, EOMI, discs not visualized.  Oral cavity is clear. No oral mucosal lesions are identified. Neck is clear without evidence of cervical or supraclavicular adenopathy. Lungs are clear to A&P. Cardiac examination is essentially unremarkable with regular rate and rhythm without murmur rub or thrill.  Abdomen is benign with no organomegaly or masses noted. Motor sensory and DTR levels are equal and symmetric in the upper and lower extremities. Cranial nerves II through XII are grossly intact. Proprioception is intact. No peripheral adenopathy or edema is identified. No motor or sensory levels are noted. Crude visual fields are within normal range.  LABORATORY DATA: Pathology reports reviewed    RADIOLOGY RESULTS: CT scan reviewed PSMA PET scan ordered   IMPRESSION: At least stage IIIa adenocarcinoma the prostate Gleason score of 7 (25+49) in 53 year old male awaiting PSMA PET scan for complete staging  PLAN: At this time I ordered a PSMA PET scan for staging.  Should this show no evidence of local regional spread would plan on delivering 80 Gray over 8 weeks using IMRT radiation therapy.  I have run the [facility] nomogram based on the information I have a presently showing approximately over 50% chance of lymph node involvement as well as a high up chance of extracapsular extension.  I would use his PSMA PET scan to make determination on pelvic lymph node treatment as well as his prostate.  Risks and benefits of treatment including increased lower Neri tract symptoms possible diarrhea fatigue alteration blood counts skin reaction all were reviewed in detail  with the patient.  I have set him up for follow-up after his PSMA PET scan and we will make complete treatment decisions at that time based on his stage.  Patient comprehends my recommendations well.  I would like to take this opportunity to thank you for allowing me to participate in the care of your patient.SABRA Marcey Penton, MD

## 2024-02-16 NOTE — Telephone Encounter (Signed)
 Incoming call from patient who wife who would like a call back after meeting with Dr. Lenn today. Please advise if you would like me to set up a virtual or are available for a telephone visit.

## 2024-02-17 ENCOUNTER — Ambulatory Visit (INDEPENDENT_AMBULATORY_CARE_PROVIDER_SITE_OTHER): Admitting: Urology

## 2024-02-17 DIAGNOSIS — C61 Malignant neoplasm of prostate: Secondary | ICD-10-CM

## 2024-02-17 NOTE — Progress Notes (Signed)
 Virtual Visit via Telephone Note  I connected with Johnathan Eaton on 02/17/24 at 10:00 AM EDT by telephone and verified that I am speaking with the correct person using two identifiers.   Patient location: Home Provider location: [facility]   I discussed the limitations, risks, security and privacy concerns of performing an evaluation and management service by telephone and the availability of in person appointments. We discussed the impact of the COVID-19 pandemic on the healthcare system, and the importance of social distancing and reducing patient and provider exposure. I also discussed with the patient that there may be a patient responsible charge related to this service. The patient expressed understanding and agreed to proceed.  Reason for visit: Prostate cancer treatment options  History of Present Illness: 53 year old male originally seen by PCP February 2025 and diagnosed with prostatitis and treated with antibiotics, PSA was elevated at 30 at that visit, he did not return for repeat PSA.  He developed Foley dependent urinary retention and failed voiding trials.  DRE was benign.  CT abdomen and pelvis with contrast 01/14/2024 showed no evidence of prostate cancer or lymphadenopathy and prostate measured 50 g.  Ultimately he deferred prostate biopsy and opted for HOLEP.  HOLEP 01/28/2024 showed 42 g of tissue with Gleason score 4+3=7 prostate adenocarcinoma.  He is healing well and denies any incontinence, urinating with a strong stream.  We have previously discussed options for his prostate cancer including radiation or radical prostatectomy.  Lean toward radiation with his recent HOLEP based on significantly lower risk of surgical complications and side effects, incontinence, ED.   I recommended starting by checking a PSA next week to evaluate disease burden, if PSA is less than 1 could consider observation of PSA before pursuing more aggressive treatment like  radiation, however if PSA is greater than 1 would strongly recommend PSMA PET scan and likely radiation.  They previously met with Dr. Lenn, unfortunately did not sound like this was a good experience and they would like to see a different radiation oncologist   Follow Up:  PSA next week, call with results.  Anticipate this will be greater than 1 and will need PSMA PET scan and likely radiation.  If PSA is very low or less than 1 could consider observation with very close PSA monitoring with the idea of decreasing potential risk of side effects from radiation by allowing him longer healing time after HOLEP.  We discussed challenges of interpreting the pathology as if he had a central tumor all of the cancer may have been removed, but more likely some remains along the prostate capsule.  Reassurance provided regarding recent normal CT scan, but discussed improved sensitivity of PSMA PET scan over CT    I discussed the assessment and treatment plan with the patient. The patient was provided an opportunity to ask questions and all were answered. The patient agreed with the plan and demonstrated an understanding of the instructions.   The patient was advised to call back or seek an in-person evaluation if the symptoms worsen or if the condition fails to improve as anticipated.    Redell JAYSON Burnet, MD

## 2024-02-17 NOTE — Telephone Encounter (Signed)
 Patient scheduled.

## 2024-02-23 ENCOUNTER — Ambulatory Visit: Admission: RE | Admit: 2024-02-23 | Source: Ambulatory Visit

## 2024-02-25 ENCOUNTER — Other Ambulatory Visit
Admission: RE | Admit: 2024-02-25 | Discharge: 2024-02-25 | Disposition: A | Discharge status: Home / Self Care | Attending: Urology | Admitting: Urology

## 2024-02-25 DIAGNOSIS — R972 Elevated prostate specific antigen [PSA]: Secondary | ICD-10-CM | POA: Insufficient documentation

## 2024-02-25 LAB — PSA: Prostatic Specific Antigen: 11.79 ng/mL — ABNORMAL HIGH (ref 0.00–4.00)

## 2024-02-28 ENCOUNTER — Ambulatory Visit: Payer: Self-pay | Admitting: Urology

## 2024-02-28 ENCOUNTER — Ambulatory Visit: Admitting: Radiation Oncology

## 2024-02-28 DIAGNOSIS — C61 Malignant neoplasm of prostate: Secondary | ICD-10-CM

## 2024-02-28 NOTE — Telephone Encounter (Signed)
 Referral placed to Dr. Patrcia.

## 2024-03-01 ENCOUNTER — Telehealth: Payer: Self-pay | Admitting: Urology

## 2024-03-01 NOTE — Telephone Encounter (Signed)
 LMOM to let pt know we have paperwork he needs to come by and sign.

## 2024-03-02 ENCOUNTER — Telehealth: Payer: Self-pay

## 2024-03-02 NOTE — Telephone Encounter (Signed)
 Pt called in a stated he wasn't able to get into his mychart at the moment. I went over Dr. Francisca message with him about his PSA. Pt voiced understanding. He stated he will wait for [facility] to reach out to him to set up the scan.

## 2024-03-07 ENCOUNTER — Other Ambulatory Visit: Payer: Self-pay | Admitting: Urology

## 2024-03-07 DIAGNOSIS — C61 Malignant neoplasm of prostate: Secondary | ICD-10-CM

## 2024-03-09 ENCOUNTER — Telehealth: Payer: Self-pay | Admitting: Radiation Oncology

## 2024-03-09 NOTE — Telephone Encounter (Signed)
 10/9 Left voicemail for patient to call our office to be r/s for consult. Waiting on call to confirm date/time.

## 2024-03-10 ENCOUNTER — Telehealth: Payer: Self-pay | Admitting: Radiation Oncology

## 2024-03-10 NOTE — Telephone Encounter (Signed)
 10/10 call both patient's contact numbers, left voicemail for patient to call our office to be r/s for his consult.

## 2024-03-13 ENCOUNTER — Encounter (HOSPITAL_COMMUNITY)
Admission: RE | Admit: 2024-03-13 | Discharge: 2024-03-13 | Disposition: A | Discharge status: Home / Self Care | Source: Ambulatory Visit | Attending: Urology | Admitting: Urology

## 2024-03-13 DIAGNOSIS — C61 Malignant neoplasm of prostate: Secondary | ICD-10-CM | POA: Insufficient documentation

## 2024-03-13 MED ORDER — FLOTUFOLASTAT F 18 GALLIUM 296-5846 MBQ/ML IV SOLN
8.5800 | Freq: Once | INTRAVENOUS | Status: AC
  Administered 2024-03-13: 8.58 via INTRAVENOUS

## 2024-03-14 ENCOUNTER — Telehealth: Payer: Self-pay

## 2024-03-14 ENCOUNTER — Ambulatory Visit: Payer: Self-pay | Admitting: Urology

## 2024-03-14 NOTE — Telephone Encounter (Signed)
 Incoming call from pt with concerns about who will give him results from his PET scan which was performed yesterday. Does not have follow up with Dr. Patrcia until 03/22/24. Please advise.

## 2024-03-16 NOTE — Progress Notes (Signed)
 GU Location of Tumor / Histology: Prostate CA  If Prostate Cancer, Gleason Score is (4 + 3) and PSA is (11.79 on 02/25/2024)  Johnathan Eaton presented as referral from Dr. Redell BROCKS. [facility]) elevated PSA.  Biopsies   03/13/2024 Dr. Redell Burnet NM PET (PSMA) Skull To Mid Thigh CLINICAL DATA:  High risk prostate carcinoma. Staging.   IMPRESSION: Focal radiotracer accumulation in the left prostatic apex, consistent with site of primary prostate carcinoma. No evidence of metastatic lymphadenopathy or visceral metastatic disease.  Focal area of mild radiotracer accumulation in the right scapula, which corresponds to a small lucency with peripheral sclerosis. Solitary osseous metastasis is considered unlikely, but cannot definitely be excluded. MRI may be helpful for further evaluation.   01/14/2024 Dr. Redell Burnet CT Abdomen Pelvis with/without Contrast CLINICAL DATA:  Abdominal pain, urinary retention   IMPRESSION: 1. Diffuse urinary bladder wall thickening and fat stranding, suggesting nonspecific infectious or inflammatory cystitis. Some degree of urinary bladder wall thickening is likely related to chronic outlet obstruction. Foley catheter in the bladder. 2. Prostatomegaly. 3. Sigmoid diverticulosis without evidence of acute diverticulitis. Aortic Atherosclerosis (ICD10-I70.0).   Past/Anticipated interventions by urology, if any:  Dr. Redell Burnet   Past/Anticipated interventions by medical oncology, if any: NA  Weight changes, if any:  No  IPSS:  4 SHIM:  21  Bowel/Bladder complaints, if any:  No  Nausea/Vomiting, if any:  No  Pain issues, if any:  0/10  SAFETY ISSUES: Prior radiation?  No Pacemaker/ICD? No Possible current pregnancy? Male Is the patient on methotrexate? No  Current Complaints / other details:  None  30 minutes spent total, including time for meaningful use questions, reviewing medication, as well  as spent in face-to-face time in nurse evaluation with the patient.

## 2024-03-22 ENCOUNTER — Ambulatory Visit
Admission: RE | Admit: 2024-03-22 | Discharge: 2024-03-22 | Disposition: A | Discharge status: Home / Self Care | Source: Ambulatory Visit | Attending: Radiation Oncology | Admitting: Radiation Oncology

## 2024-03-22 ENCOUNTER — Encounter: Payer: Self-pay | Admitting: Radiation Oncology

## 2024-03-22 VITALS — HR 100 | Ht 69.0 in | Wt 210.0 lb

## 2024-03-22 DIAGNOSIS — J45909 Unspecified asthma, uncomplicated: Secondary | ICD-10-CM | POA: Diagnosis not present

## 2024-03-22 DIAGNOSIS — K219 Gastro-esophageal reflux disease without esophagitis: Secondary | ICD-10-CM | POA: Diagnosis not present

## 2024-03-22 DIAGNOSIS — M109 Gout, unspecified: Secondary | ICD-10-CM | POA: Insufficient documentation

## 2024-03-22 DIAGNOSIS — Z79899 Other long term (current) drug therapy: Secondary | ICD-10-CM | POA: Insufficient documentation

## 2024-03-22 DIAGNOSIS — R718 Other abnormality of red blood cells: Secondary | ICD-10-CM | POA: Insufficient documentation

## 2024-03-22 DIAGNOSIS — C61 Malignant neoplasm of prostate: Secondary | ICD-10-CM | POA: Insufficient documentation

## 2024-03-22 DIAGNOSIS — I10 Essential (primary) hypertension: Secondary | ICD-10-CM | POA: Diagnosis not present

## 2024-03-22 DIAGNOSIS — E785 Hyperlipidemia, unspecified: Secondary | ICD-10-CM | POA: Diagnosis not present

## 2024-03-22 DIAGNOSIS — R569 Unspecified convulsions: Secondary | ICD-10-CM | POA: Insufficient documentation

## 2024-03-22 DIAGNOSIS — Z801 Family history of malignant neoplasm of trachea, bronchus and lung: Secondary | ICD-10-CM | POA: Insufficient documentation
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
## 2024-03-23 ENCOUNTER — Other Ambulatory Visit: Payer: Self-pay | Admitting: Family Medicine

## 2024-03-23 DIAGNOSIS — F411 Generalized anxiety disorder: Secondary | ICD-10-CM

## 2024-03-23 DIAGNOSIS — F341 Dysthymic disorder: Secondary | ICD-10-CM

## 2024-03-23 DIAGNOSIS — I10 Essential (primary) hypertension: Secondary | ICD-10-CM

## 2024-03-23 NOTE — Progress Notes (Signed)
 Spoke with patient via telephone to introduce myself as the prostate nurse navigator and discussed my role.  Dr. Francisca was notified of recent consult and patient aware that he is at an extremely high risk for incontinence, ED after recent HOLEP.  Patient still feels strongly that he would like to meet with a surgeon to have full discussion.  RN will coordinate for appointment and continue to follow to ensure treatment decision in a timely manner. RN provided patient my direct contact.

## 2024-03-24 ENCOUNTER — Telehealth: Payer: Self-pay | Admitting: Radiation Oncology

## 2024-03-24 ENCOUNTER — Telehealth: Payer: Self-pay | Admitting: *Deleted

## 2024-03-24 ENCOUNTER — Other Ambulatory Visit: Payer: Self-pay | Admitting: Urology

## 2024-03-24 ENCOUNTER — Ambulatory Visit

## 2024-03-24 ENCOUNTER — Ambulatory Visit: Admitting: Radiation Oncology

## 2024-03-24 DIAGNOSIS — C61 Malignant neoplasm of prostate: Secondary | ICD-10-CM

## 2024-03-24 NOTE — Telephone Encounter (Signed)
 CALLED PATIENT TO INFORM OF [facility] APPT. WITH DR. MANNY ON 05-01-24- ARRIVAL TIME- 1:30 PM, SPOKE WITH PATIENT AND HE IS AWARE OF THIS APPT.

## 2024-03-24 NOTE — Telephone Encounter (Signed)
 Faxed referral to [facility] for pt to see Dr. Renda

## 2024-03-27 ENCOUNTER — Other Ambulatory Visit: Payer: Self-pay

## 2024-03-27 DIAGNOSIS — I10 Essential (primary) hypertension: Secondary | ICD-10-CM

## 2024-03-27 MED ORDER — AMLODIPINE BESYLATE-VALSARTAN 5-160 MG PO TABS: 1.0000 | ORAL_TABLET | Freq: Every day | ORAL | 0 refills | Status: DC

## 2024-03-28 ENCOUNTER — Telehealth: Payer: Self-pay

## 2024-03-28 NOTE — Progress Notes (Signed)
 Patient has confirmed he would like to proceed with LT-ADT and 8 weeks of daily radiation.  Patient does wish to cancel upcoming surgical consult, RN will inform [facility] of this.    MD's notified, plan of care in progress.

## 2024-03-28 NOTE — Telephone Encounter (Signed)
 Eligard PA added to pharmacy auth list. Awaiting response.

## 2024-04-03 ENCOUNTER — Telehealth: Payer: Self-pay | Admitting: Radiation Oncology

## 2024-04-03 NOTE — Telephone Encounter (Signed)
 The patient has decided not to pursue surgery for treatment. Ok to close referral per PA Bruning. Fax sent to [facility] to advise this referral is no longer needed.

## 2024-04-03 NOTE — Telephone Encounter (Signed)
 Reached out to [facility] regarding referral created 10/24 by PA Bruning for pt to be seen by Dr. Renda regarding surgical options. Call transferred to triage nurse who advised pt is currently scheduled with Dr. Alvaro 12/1, no sooner availability for him or Dr. Renda at this time.  I advised I would reach out to provider team to see if referral to Dr. Renda is still needed and would update [facility] team as needed.

## 2024-04-04 ENCOUNTER — Telehealth: Payer: Self-pay | Admitting: Pharmacy Technician

## 2024-04-04 NOTE — Telephone Encounter (Signed)
 Auth Submission: NO AUTH NEEDED Site of care: [facility] Payer: BCBS - Medication & CPT/J Code(s) submitted: ELIGARD [id number] Diagnosis Code:  Route of submission (phone, fax, portal): FAXED Phone # Fax # Auth type: Buy/Bill PB Units/visits requested: 45MG  Q6MONTHS Reference number: [id number] Approval from: 04/04/24 to 05/31/24    CO-PAY CARD: [employer] aware

## 2024-04-05 NOTE — Telephone Encounter (Signed)
 Per Pharmacy team no auth required for Eligard. Appt scheduled.
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
## 2024-04-06 NOTE — Progress Notes (Signed)
 Patient is now set up for ADT with Dr. Marval office on 04/10/24.  Will continue to follow.

## 2024-04-10 ENCOUNTER — Encounter: Payer: Self-pay | Admitting: Urology

## 2024-04-10 ENCOUNTER — Ambulatory Visit (INDEPENDENT_AMBULATORY_CARE_PROVIDER_SITE_OTHER): Admitting: Urology

## 2024-04-10 VITALS — BP 163/104 | HR 104 | Wt 208.0 lb

## 2024-04-10 DIAGNOSIS — C61 Malignant neoplasm of prostate: Secondary | ICD-10-CM | POA: Diagnosis not present

## 2024-04-10 DIAGNOSIS — N138 Other obstructive and reflux uropathy: Secondary | ICD-10-CM

## 2024-04-10 NOTE — Progress Notes (Signed)
 Eligard SubQ Injection   Due to Prostate Cancer patient is present today for a Eligard Injection.  Medication: Eligard 6 month Dose: 45 mg  Location: left  Lot: [id number] Exp: 0/2026  Patient tolerated well, no complications were noted  Performed by: Andrea Kirks LPN   Per Clotilda Canterbury PA  patient is to continue therapy for 6 months . Patient's next follow up was scheduled for 10/16/2024. This appointment was scheduled using wheel and given to patient today along with reminder continue on Vitamin D 800-1000iu and Calcium 1000-1200mg  daily while on Androgen Deprivation Therapy.

## 2024-04-10 NOTE — Patient Instructions (Signed)
 Discussed importance of bone health on ADT, recommend 1000-1200 mg daily calcium suppliment and [phone] IU vit D daily.  Also encouraged weight being exercises and cardiovascular health.   Calcium is best taken throughout the day with food, so 400 mg 3 times daily would be ideal.  Vitamin D can be taken once daily, but it will work best when taken with a fatty meal.  It can also be taken with a tablespoon of olive oil.  The vitamin D3 is the most active of the vitamin D's and I recommend taking this form.  I also recommend following a Mediterranean diet as this has been shown to be the most heart protective and performing exercises with resistance and weights to promote bone growth.

## 2024-04-11 NOTE — Progress Notes (Signed)
 Patient received Eligard 45mg  on 11/10 with urology group.    Patient is scheduled to meet with [facility] on 12/1 to establish care in anticipation of fiducial marker's and spaceOAR gel.    RN left message for call back to review next steps.

## 2024-04-14 MED ORDER — LEUPROLIDE ACETATE (6 MONTH) 45 MG ~~LOC~~ KIT
45.0000 mg | PACK | Freq: Once | SUBCUTANEOUS | Status: AC
  Administered 2024-04-10: 45 mg via SUBCUTANEOUS

## 2024-04-14 NOTE — Addendum Note (Signed)
 Addended by: Ezme Duch A on: 04/14/2024 12:04 PM   Modules accepted: Orders

## 2024-04-18 ENCOUNTER — Encounter: Payer: Self-pay | Admitting: Family Medicine

## 2024-04-18 ENCOUNTER — Ambulatory Visit (INDEPENDENT_AMBULATORY_CARE_PROVIDER_SITE_OTHER): Admitting: Family Medicine

## 2024-04-18 VITALS — BP 136/88 | HR 98 | Resp 16 | Ht 69.0 in | Wt 214.5 lb

## 2024-04-18 DIAGNOSIS — M79606 Pain in leg, unspecified: Secondary | ICD-10-CM

## 2024-04-18 DIAGNOSIS — F341 Dysthymic disorder: Secondary | ICD-10-CM

## 2024-04-18 DIAGNOSIS — I700 Atherosclerosis of aorta: Secondary | ICD-10-CM

## 2024-04-18 DIAGNOSIS — C61 Malignant neoplasm of prostate: Secondary | ICD-10-CM | POA: Diagnosis not present

## 2024-04-18 DIAGNOSIS — F411 Generalized anxiety disorder: Secondary | ICD-10-CM | POA: Diagnosis not present

## 2024-04-18 DIAGNOSIS — K219 Gastro-esophageal reflux disease without esophagitis: Secondary | ICD-10-CM

## 2024-04-18 DIAGNOSIS — I10 Essential (primary) hypertension: Secondary | ICD-10-CM

## 2024-04-18 DIAGNOSIS — F410 Panic disorder [episodic paroxysmal anxiety] without agoraphobia: Secondary | ICD-10-CM

## 2024-04-18 DIAGNOSIS — M109 Gout, unspecified: Secondary | ICD-10-CM

## 2024-04-18 MED ORDER — ALPRAZOLAM 0.5 MG PO TABS: 0.5000 mg | ORAL_TABLET | Freq: Two times a day (BID) | ORAL | 0 refills | Status: AC | PRN

## 2024-04-18 MED ORDER — BUPROPION HCL ER (XL) 150 MG PO TB24: 150.0000 mg | ORAL_TABLET | ORAL | 1 refills | Status: AC

## 2024-04-18 MED ORDER — PANTOPRAZOLE SODIUM 40 MG PO TBEC: 40.0000 mg | DELAYED_RELEASE_TABLET | Freq: Every day | ORAL | 1 refills | Status: AC

## 2024-04-18 MED ORDER — CELECOXIB 100 MG PO CAPS: 100.0000 mg | ORAL_CAPSULE | Freq: Two times a day (BID) | ORAL | 0 refills | Status: DC

## 2024-04-18 MED ORDER — CITALOPRAM HYDROBROMIDE 20 MG PO TABS: 20.0000 mg | ORAL_TABLET | Freq: Every day | ORAL | 1 refills | Status: AC

## 2024-04-18 MED ORDER — AMLODIPINE BESYLATE-VALSARTAN 5-320 MG PO TABS: 1.0000 | ORAL_TABLET | Freq: Every day | ORAL | 1 refills | Status: AC

## 2024-04-18 NOTE — Progress Notes (Signed)
 Name: Johnathan Eaton   MRN: [id number]    DOB: Aug 04, 1970   Date:04/18/2024       Progress Note  Subjective  Chief Complaint  Chief Complaint  Patient presents with   Medical Management of Chronic Issues    Pt denied feeling depressed or anxious but requesting anxiety meds    Discussed the use of AI scribe software for clinical note transcription with the patient, who gave verbal consent to proceed.  History of Present Illness Johnathan Eaton is a 53 year old male with prostate cancer who presents for follow-up regarding his treatment and management.  He was diagnosed with prostate cancer following a high PSA level. He recalls being told that the PET scan did not show any metastatic disease outside the prostate. He had a Holep-laser enucleations of the prostate done 01/28/2024 and had one dose of  Eligard injection last Monday.  He experiences sharp hip pain that radiates down his leg, primarily located in his hips, without extension to his legs. No urinary or bowel incontinence is reported. He notes tightness in his hamstrings and a pulling sensation when bending forward.  He has a history of anxiety, exacerbated by his current medical situation, affecting his blood pressure readings during medical appointments. He is currently taking bupropion  and citalopram  for anxiety and depression, and uses Xanax  as needed for acute anxiety episodes.  He is taking Exforge  for blood pressure management. He also takes pantoprazole  for reflux, which he manages with dietary changes. He is concerned about potential erectile dysfunction due to his cancer treatment, noting that the treatment might affect his testosterone levels.    Patient Active Problem List   Diagnosis Date Noted   Malignant neoplasm of prostate (HCC) 03/22/2024   Keratoconus of both eyes 02/10/2023   Gout 04/18/2017   Allergic rhinitis, seasonal 02/09/2015   GAD (generalized anxiety disorder) 02/09/2015   Benign hypertension  02/09/2015   Blood glucose elevated 02/09/2015   Gastric reflux 02/09/2015   Obesity (BMI 30-39.9) 02/09/2015    Past Surgical History:  Procedure Laterality Date   COLONOSCOPY  12/04/2020   HOLEP-LASER ENUCLEATION OF THE PROSTATE WITH MORCELLATION N/A 01/28/2024   Procedure: ENUCLEATION, PROSTATE, USING LASER, WITH MORCELLATION;  Surgeon: Francisca Redell BROCKS, MD;  Location: [facility] ORS;  Service: Urology;  Laterality: N/A;   PROSTATE BIOPSY     SHOULDER SURGERY Left    Rotator Cuff Repair   UPPER GASTROINTESTINAL ENDOSCOPY  12/04/2020   also has had in past in [place], 2007-2008    Family History  Problem Relation Age of Onset   Hypertension Mother    Dementia Mother    Lung cancer Mother        Santina through treatment   Pneumonia Mother        covid complications    Hypertension Father    Dementia Father    Hypertension Brother    Colon cancer Neg Hx    Esophageal cancer Neg Hx    Pancreatic cancer Neg Hx    Stomach cancer Neg Hx    Liver disease Neg Hx    Colon polyps Neg Hx    Rectal cancer Neg Hx     Social History   Tobacco Use   Smoking status: Never   Smokeless tobacco: Never  Substance Use Topics   Alcohol use: No    Alcohol/week: 0.0 standard drinks of alcohol     Current Outpatient Medications:    ALPRAZolam  (XANAX ) 0.5 MG tablet, Take 1 tablet (  0.5 mg total) by mouth 2 (two) times daily as needed for anxiety., Disp: 30 tablet, Rfl: 0   amLODipine -valsartan  (EXFORGE ) 5-160 MG tablet, Take 1 tablet by mouth daily. For blood pressure, Disp: 30 tablet, Rfl: 0   buPROPion  (WELLBUTRIN  XL) 150 MG 24 hr tablet, Take 1 tablet (150 mg total) by mouth every morning. For depression/energy, Disp: 90 tablet, Rfl: 1   citalopram  (CELEXA ) 20 MG tablet, TAKE 1 TABLET BY MOUTH EVERY DAY, Disp: 90 tablet, Rfl: 0   pantoprazole  (PROTONIX ) 40 MG tablet, Take 1 tablet (40 mg total) by mouth daily before supper. For heartburn, Disp: 90 tablet, Rfl: 1  No Known Allergies  I  personally reviewed active problem list, medication list, allergies, family history with the patient/caregiver today.   ROS  Ten systems reviewed and is negative except as mentioned in HPI    Objective Physical Exam CONSTITUTIONAL: Patient appears well-developed and well-nourished. No distress. HEENT: Head atraumatic, normocephalic, neck supple. CARDIOVASCULAR: Normal rate, regular rhythm and normal heart sounds. No murmur heard. No BLE edema. PULMONARY: Effort normal and breath sounds normal. No respiratory distress. ABDOMINAL: There is no tenderness or distention. MUSCULOSKELETAL: Normal gait. Without gross motor or sensory deficit. No tenderness on palpation of the spine. Pain on forward flexion and backward extension of the spine. No pain on lateral flexion. Mild pain on twisting of the spine. Limited forward flexion. Tightness in the hamstrings on forward flexion. PSYCHIATRIC: Patient has a normal mood and affect. Behavior is normal. Judgment and thought content normal.  Vitals:   04/18/24 1056  BP: 136/88  Pulse: 98  Resp: 16  SpO2: 99%  Weight: 214 lb 8 oz (97.3 kg)  Height: 5' 9 (1.753 m)    Body mass index is 31.68 kg/m.  Recent Results (from the past 2160 hours)  CULTURE, URINE COMPREHENSIVE     Status: Abnormal   Collection Time: 01/19/24  2:38 PM   Specimen: Urine   UR  Result Value Ref Range   Urine Culture, Comprehensive Final report (A)    Organism ID, Bacteria Escherichia coli (A)     Comment: Cefazolin  with an MIC <=16 predicts susceptibility to the oral agents cefaclor, cefdinir, cefpodoxime, cefprozil, cefuroxime, cephalexin, and loracarbef when used for therapy of uncomplicated urinary tract infections due to E. coli, Klebsiella pneumoniae, and Proteus mirabilis. Multi-Drug Resistant Organism Greater than 100,000 colony forming units per mL    ANTIMICROBIAL SUSCEPTIBILITY Comment     Comment:       ** S = Susceptible; I = Intermediate; R = Resistant  **                    P = Positive; N = Negative             MICS are expressed in micrograms per mL    Antibiotic                 RSLT#1    RSLT#2    RSLT#3    RSLT#4 Amoxicillin/Clavulanic Acid    S Ampicillin                     R Cefazolin                       S Cefepime                       S Cefoxitin  S Cefpodoxime                    S Ceftriaxone                    S Ciprofloxacin                   S Ertapenem                      S Gentamicin                     S Levofloxacin                   I Meropenem                      S Nitrofurantoin                 S Piperacillin/Tazobactam        S Tetracycline                   R Tobramycin                     S Trimethoprim /Sulfa              R   Urinalysis, Complete     Status: Abnormal   Collection Time: 01/19/24  2:38 PM  Result Value Ref Range   Specific Gravity, UA 1.025 1.005 - 1.030   pH, UA 6.0 5.0 - 7.5   Color, UA Yellow Yellow   Appearance Ur Cloudy (A) Clear   Leukocytes,UA 2+ (A) Negative   Protein,UA 1+ (A) Negative/Trace   Glucose, UA Negative Negative   Ketones, UA Negative Negative   RBC, UA 3+ (A) Negative   Bilirubin, UA Negative Negative   Urobilinogen, Ur 1.0 0.2 - 1.0 mg/dL   Nitrite, UA Positive (A) Negative   Microscopic Examination See below:     Comment: Microscopic was indicated and was performed.  Microscopic Examination     Status: Abnormal   Collection Time: 01/19/24  2:38 PM   Urine  Result Value Ref Range   WBC, UA >30 (A) 0 - 5 /hpf   RBC, Urine 11-30 (A) 0 - 2 /hpf   Epithelial Cells (non renal) 0-10 0 - 10 /hpf   Crystals Present (A) N/A   Crystal Type Calcium Oxalate N/A   Bacteria, UA Many (A) None seen/Few  CBC     Status: Abnormal   Collection Time: 01/25/24 12:42 PM  Result Value Ref Range   WBC 5.5 4.0 - 10.5 K/uL   RBC 5.44 4.22 - 5.81 MIL/uL   Hemoglobin 13.4 13.0 - 17.0 g/dL   HCT 58.8 60.9 - 47.9 %   MCV 75.6 (L) 80.0 - 100.0 fL   MCH 24.6  (L) 26.0 - 34.0 pg   MCHC 32.6 30.0 - 36.0 g/dL   RDW 84.6 88.4 - 84.4 %   Platelets 286 150 - 400 K/uL   nRBC 0.0 0.0 - 0.2 %    Comment: Performed at [facility], [address] [place], [place] [address]  Basic metabolic panel     Status: Abnormal   Collection Time: 01/25/24 12:42 PM  Result Value Ref Range   Sodium 139 135 - 145 mmol/L   Potassium 3.7 3.5 - 5.1 mmol/L   Chloride 104 98 - 111 mmol/L  CO2 27 22 - 32 mmol/L   Glucose, Bld 119 (H) 70 - 99 mg/dL    Comment: Glucose reference range applies only to samples taken after fasting for at least 8 hours.   BUN 17 6 - 20 mg/dL   Creatinine, Ser 8.84 0.61 - 1.24 mg/dL   Calcium 9.3 8.9 - 89.6 mg/dL   GFR, Estimated >39 >39 mL/min    Comment: (NOTE) Calculated using the CKD-EPI Creatinine Equation (2021)    Anion gap 8 5 - 15    Comment: Performed at [facility], [address], [place], [place] [address]  Surgical pathology     Status: None   Collection Time: 01/28/24 12:00 AM  Result Value Ref Range   SURGICAL PATHOLOGY      SURGICAL PATHOLOGY [facility] [address], [address] [place], [place] [address] Telephone [phone] or [phone] Fax [phone]  REPORT OF SURGICAL PATHOLOGY   Accession #: [id number] Patient Name: AHMET, SCHANK Visit # : [id number]  MRN: [id number] Physician: Francisca Rogue DOB/Age [birth year] (Age: 53) Gender: M Collected Date: 01/28/2024 Received Date: 02/01/2024  FINAL DIAGNOSIS       1. Prostate, TUR, prostate chips :       PROSTATIC ADENOCARCINOMA, GLEASON SCORE 4+3=7 (GRADE GROUP 3) INVOLVES  90% OF      SUBMITTED TISSUE (PATTERN 4= 70%). PERINEURAL INVASION IDENTIFIED.       DATE SIGNED OUT: 02/02/2024 ELECTRONIC SIGNATURE : Stuart Come, Zhaoli, Pathologist, Electronic Signature  MICROSCOPIC DESCRIPTION 1. Gleason scores can be grouped and range from Prognostic Grade Group 1 (most favorable) to Prognostic Grade group 5  (least favorable). Prognostic Grade Group 1: Gleason score 3+3=6 Prognostic Grade Group 2: Gleaso n score 3+4=7 Prognostic Grade Group 3: Gleason score 4+3=7 Prognostic Grade Group 4: Gleason score 4+4=8 Prognostic Grade Group 5: Gleason score 9-10 This new grading system, using the above terminology, has been accepted by the 2016 World Health Organization (WHO) Elbert ESPY, Zelefsky MJ, Sjoberg DD, et al. A Contemporary prostate cancer grading system: A validated alternative to the Gleason score. Eur Urol (2015).  CASE COMMENTS STAINS USED IN DIAGNOSIS: H&E H&E H&E H&E H&E H&E H&E H&E    CLINICAL HISTORY  SPECIMEN(S) OBTAINED 1. Prostate, TUR, Prostate Chips  SPECIMEN COMMENTS: SPECIMEN CLINICAL INFORMATION: 1. Benign prostatic hyperplasia with urinary obstruction    Gross Description 1. Received fresh and placed in formalin is a 42 g, 7 x 7 x 3.5 cm aggregate of tan soft tissue and clot material. Representative sections submitted in 8 blocks. mb 02-01-24        Report signed out from the following location(s) [place] [address] [place], [place] [address] CLIA #: [id number]  [facility] [address] [place], [place] [address] CLIA #: [id number]   PSA     Status: Abnormal   Collection Time: 02/25/24  2:59 PM  Result Value Ref Range   Prostatic Specific Antigen 11.79 (H) 0.00 - 4.00 ng/mL    Comment: (NOTE) While PSA levels of <=4.00 ng/ml are reported as reference range, some men with levels below 4.00 ng/ml can have prostate cancer and many men with PSA above 4.00 ng/ml do not have prostate cancer.  Other tests such as free PSA, age specific reference ranges, PSA velocity and PSA doubling time may be helpful especially in men less than 42 years old. Performed at [facility], [address], [place], [place] [address]  Diabetic Foot Exam:     PHQ2/9:    04/18/2024   10:53 AM 03/22/2024     1:12 PM 01/10/2024   12:57 PM 08/02/2023    2:56 PM 07/09/2023    2:41 PM  Depression screen PHQ 2/9  Decreased Interest 0 0 0 0 0  Down, Depressed, Hopeless 0 1 0 0 0  PHQ - 2 Score 0 1 0 0 0  Altered sleeping 0  0 0 0  Tired, decreased energy 0  0 0 0  Change in appetite 0  0 0 0  Feeling bad or failure about yourself  0  0 0 0  Trouble concentrating 0  0 0 0  Moving slowly or fidgety/restless 0  0 0 0  Suicidal thoughts 0  0 0 0  PHQ-9 Score 0  0  0  0   Difficult doing work/chores Not difficult at all  Not difficult at all Not difficult at all Not difficult at all     Data saved with a previous flowsheet row definition    phq 9 is negative  Fall Risk:    04/18/2024   10:53 AM 01/10/2024   12:57 PM 07/09/2023    2:41 PM 02/10/2023    3:12 PM 12/17/2022   10:33 AM  Fall Risk   Falls in the past year? 0 0 0 0 0  Number falls in past yr: 0 0 0 0 0  Injury with Fall? 0 0 0 0 0  Risk for fall due to : No Fall Risks No Fall Risks No Fall Risks No Fall Risks No Fall Risks  Follow up Falls evaluation completed Falls evaluation completed Falls prevention discussed;Education provided;Falls evaluation completed Falls prevention discussed Falls prevention discussed     Assessment & Plan Malignant neoplasm of prostate, localized, planned for radiation Localized prostate cancer with no metastasis. Radiation therapy planned. Eligard administered. Discussed potential erectile dysfunction due to testosterone suppression. - Continue with planned radiation therapy. - Scheduled follow-up with urologist in May 2026. - Monitor PSA levels annually.  Essential hypertension Blood pressure improved but not optimal. Discussed increasing Axforge dose for better control. - Increased Axforge to 5/320 mg daily.  Generalized anxiety disorder and dysthymia Anxiety exacerbated by health issues. Current medications effective. Xanax  prescribed for situational anxiety. - Continue bupropion  and citalopram . -  Prescribed Xanax  for situational anxiety.  Musculoskeletal pain, posterior hips and hamstrings Pain likely due to muscle strain or tight hamstrings. Pain exacerbated by movement. - Prescribed Celebrex twice daily with food for five days. - Advised on activity modification, including rest and pacing during activities.  Gastroesophageal reflux disease Reflux symptoms well-controlled with pantoprazole . Discussed potential exacerbation by anti-inflammatories. - Continue pantoprazole  for reflux management.

## 2024-04-23 ENCOUNTER — Other Ambulatory Visit: Payer: Self-pay | Admitting: Family Medicine

## 2024-04-23 DIAGNOSIS — I10 Essential (primary) hypertension: Secondary | ICD-10-CM

## 2024-04-24 NOTE — Telephone Encounter (Signed)
 Requested Prescriptions  Refused Prescriptions Disp Refills   amLODipine -valsartan  (EXFORGE ) 5-160 MG tablet [Pharmacy Med Name: AMLODIPINE -VALSARTAN  5-160 MG] 30 tablet 0    Sig: TAKE 1 TABLET BY MOUTH DAILY FOR BLOOD PRESSURE     Cardiovascular: CCB + ARB Combos Passed - 04/24/2024  4:52 PM      Passed - K in normal range and within 180 days    Potassium  Date Value Ref Range Status  01/25/2024 3.7 3.5 - 5.1 mmol/L Final         Passed - Cr in normal range and within 180 days    Creat  Date Value Ref Range Status  04/14/2022 1.09 0.70 - 1.30 mg/dL Final   Creatinine, Ser  Date Value Ref Range Status  01/25/2024 1.15 0.61 - 1.24 mg/dL Final         Passed - Na in normal range and within 180 days    Sodium  Date Value Ref Range Status  01/25/2024 139 135 - 145 mmol/L Final         Passed - Patient is not pregnant      Passed - Last BP in normal range    BP Readings from Last 1 Encounters:  04/18/24 136/88         Passed - Valid encounter within last 6 months    Recent Outpatient Visits           6 days ago Benign hypertension   [facility] Glenard Mire, MD   3 months ago Lower urinary tract symptoms (LUTS)   [facility] Glenard Mire, MD   8 months ago Lower urinary tract symptoms (LUTS)   [facility] Glenard Mire, MD   9 months ago Urinary problem in male   [facility] Sowles, Krichna, MD       Future Appointments             In 2 weeks Francisca, Redell BROCKS, MD [facility] [place]   In 5 months McGowan, Clotilda DELENA RIGGERS [facility] [place]

## 2024-05-01 DIAGNOSIS — R338 Other retention of urine: Secondary | ICD-10-CM | POA: Diagnosis not present

## 2024-05-01 DIAGNOSIS — C61 Malignant neoplasm of prostate: Secondary | ICD-10-CM | POA: Diagnosis not present

## 2024-05-10 ENCOUNTER — Ambulatory Visit: Admitting: Urology

## 2024-05-10 VITALS — BP 138/94 | HR 100 | Ht 69.0 in | Wt 212.0 lb

## 2024-05-10 DIAGNOSIS — N138 Other obstructive and reflux uropathy: Secondary | ICD-10-CM

## 2024-05-10 DIAGNOSIS — N401 Enlarged prostate with lower urinary tract symptoms: Secondary | ICD-10-CM

## 2024-05-10 DIAGNOSIS — C61 Malignant neoplasm of prostate: Secondary | ICD-10-CM | POA: Diagnosis not present

## 2024-05-10 NOTE — Progress Notes (Signed)
° °  05/10/2024 4:38 PM   Johnathan Eaton 06-16-1970 [id number]  Reason for visit: Follow up urinary retention, prostate cancer  History: Initially seen by PCP February 2025 and diagnosed with prostatitis, treated with antibiotics, PSA was elevated at 30 at that time, he did not return for repeat PSA as recommended Developed Foley dependent urinary retention and failed multiple voiding trials August 2025, DRE was benign, CT showed no evidence of prostate cancer or lymphadenopathy and prostate measured 50 g, he deferred prostate biopsy and opted for HOLEP to resume spontaneous voiding HOLEP 01/28/2024 showed 42 g of tissue with 90% involvement Gleason score 4+3=7 prostate cancer, PSMA PET scan showed no evidence of metastatic disease, post HOLEP PSA was 11 Has met with radiation oncology in [place] and opted for radiation and ADT  Physical Exam: BP (!) 138/94 (BP Location: Left Arm, Patient Position: Sitting, Cuff Size: Large)   Pulse 100   Ht 5' 9 (1.753 m)   Wt 212 lb (96.2 kg)   SpO2 99%   BMI 31.31 kg/m    Today: I reviewed the outside notes from radiation oncology, he has already been given his initial ADT dose on 04/10/2024 with a 60-month depot injection, plan for 18 months total per radiation oncology From what I can understand from the radiation oncology notes they would like to place fiducial markers and potentially SpaceOAR gel prior to starting radiation, and would like 2 months of ADT before initiating radiation  Plan:   Prostate cancer: Agree with 18 months ADT and external beam radiation.  I will reach out to his radiation oncologist to see if we can help facilitate placing fiducial markers here in [place], but we do not do SpaceOAR gel here in [place] and that would have to be done in [place] if they feel strongly about having that performed.  Would not anticipate him being a candidate for robotic radical prostatectomy with recent HOLEP and extremely high risk  for complications including incontinence, ED, etc.   Johnathan JAYSON Burnet, MD  [facility] [address] [place], [place] [address] [phone]

## 2024-05-11 DIAGNOSIS — C61 Malignant neoplasm of prostate: Secondary | ICD-10-CM

## 2024-05-11 NOTE — Telephone Encounter (Signed)
 Appointment scheduled. PSA ordered.

## 2024-05-17 ENCOUNTER — Other Ambulatory Visit: Payer: Self-pay | Admitting: Urology

## 2024-05-18 NOTE — Progress Notes (Signed)
 RN reached out to patient to review next steps with fiducial marker's and spaceOAR gel placement followed by CT Simulation.  Left message for call back.

## 2024-05-18 NOTE — Progress Notes (Signed)
 RN spoke with patient to review next steps with fiducial marker's, spaceOAR gel placement, and CT Simulation.  Patient would prefer to have simulation at 3 if we can accommodate due to his work schedule.  RN will see if this can be changed.  No additional needs at this time.

## 2024-05-19 ENCOUNTER — Other Ambulatory Visit: Payer: Self-pay | Admitting: Family Medicine

## 2024-05-19 ENCOUNTER — Other Ambulatory Visit: Payer: Self-pay | Admitting: Urology

## 2024-05-19 DIAGNOSIS — M79606 Pain in leg, unspecified: Secondary | ICD-10-CM

## 2024-05-19 DIAGNOSIS — C61 Malignant neoplasm of prostate: Secondary | ICD-10-CM

## 2024-05-23 NOTE — Telephone Encounter (Signed)
 Requested Prescriptions  Pending Prescriptions Disp Refills   celecoxib  (CELEBREX ) 100 MG capsule [Pharmacy Med Name: CELECOXIB  100 MG CAPSULE] 180 capsule 0    Sig: TAKE 1 CAPSULE BY MOUTH TWICE A DAY     Analgesics:  COX2 Inhibitors Failed - 05/23/2024  1:59 PM      Failed - Manual Review: Labs are only required if the patient has taken medication for more than 8 weeks.      Failed - AST in normal range and within 360 days    AST  Date Value Ref Range Status  04/14/2022 20 10 - 35 U/L Final         Failed - ALT in normal range and within 360 days    ALT  Date Value Ref Range Status  04/14/2022 17 9 - 46 U/L Final         Passed - HGB in normal range and within 360 days    Hemoglobin  Date Value Ref Range Status  01/25/2024 13.4 13.0 - 17.0 g/dL Final         Passed - Cr in normal range and within 360 days    Creat  Date Value Ref Range Status  04/14/2022 1.09 0.70 - 1.30 mg/dL Final   Creatinine, Ser  Date Value Ref Range Status  01/25/2024 1.15 0.61 - 1.24 mg/dL Final         Passed - HCT in normal range and within 360 days    HCT  Date Value Ref Range Status  01/25/2024 41.1 39.0 - 52.0 % Final         Passed - eGFR is 30 or above and within 360 days    GFR, Est African American  Date Value Ref Range Status  08/13/2020 87 > OR = 60 mL/min/1.77m2 Final   GFR, Est Non African American  Date Value Ref Range Status  08/13/2020 75 > OR = 60 mL/min/1.61m2 Final   GFR, Estimated  Date Value Ref Range Status  01/25/2024 >60 >60 mL/min Final    Comment:    (NOTE) Calculated using the CKD-EPI Creatinine Equation (2021)    eGFR  Date Value Ref Range Status  04/14/2022 82 > OR = 60 mL/min/1.10m2 Final         Passed - Patient is not pregnant      Passed - Valid encounter within last 12 months    Recent Outpatient Visits           1 month ago Benign hypertension   [facility] Glenard Mire, MD   4 months ago Lower urinary  tract symptoms (LUTS)   [facility] Glenard Mire, MD   9 months ago Lower urinary tract symptoms (LUTS)   [facility] Glenard Mire, MD   10 months ago Urinary problem in male   [facility] Glenard Mire, MD       Future Appointments             In 4 months McGowan, Clotilda DELENA RIGGERS [facility] [place]   In 5 months Francisca, Redell BROCKS, MD [facility] [place]

## 2024-06-02 NOTE — Progress Notes (Signed)
 RN left message for call back to review time change for CT Simulation.

## 2024-06-28 ENCOUNTER — Encounter (HOSPITAL_COMMUNITY): Payer: Self-pay | Admitting: Urology

## 2024-06-28 NOTE — Progress Notes (Signed)
 Spoke w/ via phone for pre-op interview--- Johnathan Eaton needs dos----  BMP per anesthesia.       Eaton results------Current EKG in Epic dated 02/01/24. COVID test -----patient states asymptomatic no test needed Arrive at -------0530 NPO after MN NO Solid Food.   Pre-Surgery Ensure or G2:  Med rec completed Medications to take morning of surgery -----Xanax -PRN, Bupropion , Celexa  and Protonix . Diabetic medication -----  GLP1 agonist last dose: GLP1 instructions:  Patient instructed no nail polish to be worn day of surgery Patient instructed to bring photo id and insurance card day of surgery Patient aware to have Driver (ride ) / caregiver    for 24 hours after surgery - Wife Johnathan Eaton Patient Special Instructions ----- Fleet enema per careers adviser instructions. Pre-Op special Instructions -----  Patient verbalized understanding of instructions that were given at this phone interview. Patient denies chest pain, sob, fever, cough at the interview.

## 2024-06-29 ENCOUNTER — Telehealth: Payer: Self-pay

## 2024-06-29 NOTE — Telephone Encounter (Signed)
 Johnathan Eaton, 54 y.o. gentleman with Stage cT2 adenocarcinoma of the prostate with Gleason score of 4+3, and PSA of 11.79.  Called stated that he received call from the hospital informing him Dr. Patrcia will need to submit treatment plan to the insurance company or they will cancel his surgery in the morning 06/30/2024.  RN informed Dr. Patrcia, [place], PA-C, Vertell Pont (prostate navigator), and Shelba Jacobson (billing).  Mr. Auld expressed anxiety and requested call back.

## 2024-06-30 ENCOUNTER — Ambulatory Visit (HOSPITAL_COMMUNITY): Admission: RE | Admit: 2024-06-30 | Source: Home / Self Care | Admitting: Urology

## 2024-06-30 ENCOUNTER — Encounter (HOSPITAL_COMMUNITY): Admission: RE | Payer: Self-pay | Source: Home / Self Care

## 2024-06-30 DIAGNOSIS — I10 Essential (primary) hypertension: Secondary | ICD-10-CM

## 2024-06-30 HISTORY — DX: Malignant (primary) neoplasm, unspecified: C80.1

## 2024-06-30 NOTE — Progress Notes (Signed)
 RN is collaborating with staff at [facility] to ensure authorization is obtained for fiducial marker's and barrigel placement.   RN spoke with patient and have updated him that this is being reviewed.  Will continue to follow.

## 2024-07-03 ENCOUNTER — Ambulatory Visit: Admitting: Radiation Oncology

## 2024-07-04 ENCOUNTER — Ambulatory Visit (HOSPITAL_COMMUNITY)

## 2024-07-04 NOTE — Progress Notes (Signed)
 RN was notified authorization is in for radiation treatment plan and is authorized.  RN notified [facility], pending new date.  Patient updated.

## 2024-07-07 NOTE — Progress Notes (Signed)
 RN has reached out to scheduling with Urology Specialist to follow up on reschedule date for fiducial marker's and barrigel placement.   Pending return message.  Will continue to follow.

## 2024-07-17 ENCOUNTER — Ambulatory Visit

## 2024-07-18 ENCOUNTER — Ambulatory Visit

## 2024-07-19 ENCOUNTER — Ambulatory Visit

## 2024-07-20 ENCOUNTER — Ambulatory Visit

## 2024-07-21 ENCOUNTER — Ambulatory Visit

## 2024-07-24 ENCOUNTER — Ambulatory Visit

## 2024-07-25 ENCOUNTER — Ambulatory Visit

## 2024-07-25 ENCOUNTER — Ambulatory Visit: Admitting: Family Medicine

## 2024-07-26 ENCOUNTER — Ambulatory Visit

## 2024-07-27 ENCOUNTER — Ambulatory Visit

## 2024-07-28 ENCOUNTER — Ambulatory Visit

## 2024-07-31 ENCOUNTER — Ambulatory Visit

## 2024-08-01 ENCOUNTER — Ambulatory Visit

## 2024-08-02 ENCOUNTER — Ambulatory Visit

## 2024-08-03 ENCOUNTER — Ambulatory Visit

## 2024-08-04 ENCOUNTER — Ambulatory Visit

## 2024-08-07 ENCOUNTER — Ambulatory Visit

## 2024-08-08 ENCOUNTER — Ambulatory Visit

## 2024-08-09 ENCOUNTER — Ambulatory Visit

## 2024-08-10 ENCOUNTER — Ambulatory Visit

## 2024-08-11 ENCOUNTER — Ambulatory Visit

## 2024-08-14 ENCOUNTER — Ambulatory Visit

## 2024-08-15 ENCOUNTER — Ambulatory Visit

## 2024-08-16 ENCOUNTER — Ambulatory Visit

## 2024-08-17 ENCOUNTER — Ambulatory Visit

## 2024-08-18 ENCOUNTER — Ambulatory Visit

## 2024-08-21 ENCOUNTER — Ambulatory Visit

## 2024-08-22 ENCOUNTER — Ambulatory Visit

## 2024-08-23 ENCOUNTER — Ambulatory Visit

## 2024-08-24 ENCOUNTER — Ambulatory Visit

## 2024-08-25 ENCOUNTER — Ambulatory Visit

## 2024-08-28 ENCOUNTER — Ambulatory Visit

## 2024-08-29 ENCOUNTER — Ambulatory Visit

## 2024-08-30 ENCOUNTER — Ambulatory Visit

## 2024-08-31 ENCOUNTER — Ambulatory Visit

## 2024-09-01 ENCOUNTER — Ambulatory Visit

## 2024-09-04 ENCOUNTER — Ambulatory Visit

## 2024-09-05 ENCOUNTER — Ambulatory Visit

## 2024-09-06 ENCOUNTER — Ambulatory Visit

## 2024-09-07 ENCOUNTER — Ambulatory Visit

## 2024-09-08 ENCOUNTER — Ambulatory Visit

## 2024-10-16 ENCOUNTER — Ambulatory Visit: Admitting: Urology

## 2024-11-03 ENCOUNTER — Other Ambulatory Visit

## 2024-11-09 ENCOUNTER — Ambulatory Visit: Admitting: Urology
